# Patient Record
Sex: Female | Born: 1990 | Race: Black or African American | Hispanic: No | Marital: Single | Smoking: Former smoker
Health system: Southern US, Community
[De-identification: ages and names within clinical notes are randomized; demographics above are authoritative.]

## PROBLEM LIST (undated history)

## (undated) DIAGNOSIS — J45909 Unspecified asthma, uncomplicated: Secondary | ICD-10-CM

---

## 2013-05-12 ENCOUNTER — Inpatient Hospital Stay (HOSPITAL_COMMUNITY)
Admission: EM | Admit: 2013-05-12 | Discharge: 2013-05-20 | DRG: 882 | Disposition: A | Discharge status: Home / Self Care | Payer: BC Managed Care – PPO | Attending: Emergency Medicine | Admitting: Emergency Medicine

## 2013-05-12 ENCOUNTER — Emergency Department (HOSPITAL_COMMUNITY): Payer: BC Managed Care – PPO

## 2013-05-12 DIAGNOSIS — J45902 Unspecified asthma with status asthmaticus: Secondary | ICD-10-CM

## 2013-05-12 DIAGNOSIS — R000 Tachycardia, unspecified: Secondary | ICD-10-CM | POA: Diagnosis not present

## 2013-05-12 DIAGNOSIS — J45909 Unspecified asthma, uncomplicated: Secondary | ICD-10-CM

## 2013-05-12 DIAGNOSIS — J209 Acute bronchitis, unspecified: Secondary | ICD-10-CM | POA: Diagnosis present

## 2013-05-12 DIAGNOSIS — N390 Urinary tract infection, site not specified: Secondary | ICD-10-CM | POA: Diagnosis present

## 2013-05-12 DIAGNOSIS — J45901 Unspecified asthma with (acute) exacerbation: Secondary | ICD-10-CM

## 2013-05-12 DIAGNOSIS — I82A12 Acute embolism and thrombosis of left axillary vein: Secondary | ICD-10-CM

## 2013-05-12 DIAGNOSIS — R0603 Acute respiratory distress: Secondary | ICD-10-CM

## 2013-05-12 DIAGNOSIS — Z79899 Other long term (current) drug therapy: Secondary | ICD-10-CM

## 2013-05-12 DIAGNOSIS — Z720 Tobacco use: Secondary | ICD-10-CM

## 2013-05-12 DIAGNOSIS — F17200 Nicotine dependence, unspecified, uncomplicated: Secondary | ICD-10-CM | POA: Diagnosis present

## 2013-05-12 DIAGNOSIS — J9690 Respiratory failure, unspecified, unspecified whether with hypoxia or hypercapnia: Secondary | ICD-10-CM

## 2013-05-12 DIAGNOSIS — I82A19 Acute embolism and thrombosis of unspecified axillary vein: Secondary | ICD-10-CM | POA: Diagnosis not present

## 2013-05-12 DIAGNOSIS — J9600 Acute respiratory failure, unspecified whether with hypoxia or hypercapnia: Principal | ICD-10-CM | POA: Diagnosis present

## 2013-05-12 DIAGNOSIS — T380X5A Adverse effect of glucocorticoids and synthetic analogues, initial encounter: Secondary | ICD-10-CM | POA: Diagnosis not present

## 2013-05-12 DIAGNOSIS — I959 Hypotension, unspecified: Secondary | ICD-10-CM | POA: Diagnosis not present

## 2013-05-12 DIAGNOSIS — R7309 Other abnormal glucose: Secondary | ICD-10-CM | POA: Diagnosis not present

## 2013-05-12 DIAGNOSIS — E876 Hypokalemia: Secondary | ICD-10-CM | POA: Diagnosis present

## 2013-05-12 DIAGNOSIS — B952 Enterococcus as the cause of diseases classified elsewhere: Secondary | ICD-10-CM | POA: Diagnosis present

## 2013-05-12 DIAGNOSIS — J4520 Mild intermittent asthma, uncomplicated: Secondary | ICD-10-CM | POA: Diagnosis present

## 2013-05-12 HISTORY — DX: Unspecified asthma, uncomplicated: J45.909

## 2013-05-12 MED ORDER — IPRATROPIUM BROMIDE 0.02 % IN SOLN
1.0000 mg | Freq: Once | RESPIRATORY_TRACT | Status: AC
  Administered 2013-05-12: 1 mg via RESPIRATORY_TRACT

## 2013-05-12 MED ORDER — CETIRIZINE HCL 5 MG/5ML PO SYRP
5.0000 mg | ORAL_SOLUTION | Freq: Once | ORAL | Status: AC
  Administered 2013-05-13: 5 mg via ORAL
  Filled 2013-05-12: qty 5

## 2013-05-12 MED ORDER — ALBUTEROL (5 MG/ML) CONTINUOUS INHALATION SOLN
20.0000 mg/h | INHALATION_SOLUTION | RESPIRATORY_TRACT | Status: DC
  Administered 2013-05-12: 20 mg/h via RESPIRATORY_TRACT
  Filled 2013-05-12: qty 20

## 2013-05-12 MED ORDER — MAGNESIUM SULFATE 40 MG/ML IJ SOLN
2.0000 g | INTRAMUSCULAR | Status: AC
  Administered 2013-05-12: 2 g via INTRAVENOUS
  Filled 2013-05-12: qty 50

## 2013-05-12 MED ORDER — FAMOTIDINE IN NACL 20-0.9 MG/50ML-% IV SOLN
20.0000 mg | Freq: Once | INTRAVENOUS | Status: AC
  Administered 2013-05-13: 20 mg via INTRAVENOUS
  Filled 2013-05-12: qty 50

## 2013-05-12 MED ORDER — SODIUM CHLORIDE 0.9 % IV SOLN
Freq: Once | INTRAVENOUS | Status: AC
  Administered 2013-05-13: via INTRAVENOUS

## 2013-05-12 MED ORDER — ONDANSETRON HCL 4 MG/2ML IJ SOLN
4.0000 mg | Freq: Once | INTRAMUSCULAR | Status: AC
  Administered 2013-05-12: 4 mg via INTRAVENOUS
  Filled 2013-05-12: qty 2

## 2013-05-12 NOTE — ED Provider Notes (Signed)
CSN: [id number]     Arrival date & time 05/12/13  2246 History   First MD Initiated Contact with Patient 05/12/13 2246     Chief Complaint  Patient presents with  . Shortness of Breath   (Consider location/radiation/quality/duration/timing/severity/associated sxs/prior Treatment) The history is provided by the patient, medical records, a friend, a significant other, the EMS personnel and a parent. No language interpreter was used.    Catherine Morrow is a 22 y.o. female  with a hx of asthma presents to the Emergency Department complaining of gradual, persistent, progressively worsening difficulty breathing onset yesterday evening.  Pt reports she filled her albuterol inhaler yesterday evening and began using it this AM and has used it throughout the day.  Pt denies sick contacts, fever. Associated symptoms include wheezing, DOE, chest tightness.  Nothing makes it better and nothing makes it worse.  Pt denies fever, chills, headache, neck pain, chest pain, abd pain, N/V/D, weakness, dizziness, syncope, dysuria, hematuria.    PT given albuterol 5mg , atrovent 05.mg and solumedrol 125mg  IV by EMS enroute.  Pt's mother reports an episode similar when she was a child, but she was not hospitalized for the event. She denies ever having an intubation because of her asthma.     Past Medical History  Diagnosis Date  . Asthma    History reviewed. No pertinent past surgical history. No family history on file. History  Substance Use Topics  . Smoking status: Not on file  . Smokeless tobacco: Not on file  . Alcohol Use: Not on file   OB History   Grav Para Term Preterm Abortions TAB SAB Ect Mult Living                 Review of Systems  Constitutional: Negative for fever, diaphoresis, appetite change, fatigue and unexpected weight change.  HENT: Negative for mouth sores and neck stiffness.   Eyes: Negative for visual disturbance.  Respiratory: Positive for cough, chest tightness, shortness of breath  and wheezing.   Cardiovascular: Negative for chest pain.  Gastrointestinal: Negative for nausea, vomiting, abdominal pain, diarrhea and constipation.  Endocrine: Negative for polydipsia, polyphagia and polyuria.  Genitourinary: Negative for dysuria, urgency, frequency and hematuria.  Musculoskeletal: Negative for back pain.  Skin: Negative for rash.  Allergic/Immunologic: Negative for immunocompromised state.  Neurological: Negative for syncope, light-headedness and headaches.  Hematological: Does not bruise/bleed easily.  Psychiatric/Behavioral: Negative for sleep disturbance. The patient is not nervous/anxious.     Allergies  Review of patient's allergies indicates no known allergies.  Home Medications   Current Outpatient Rx  Name  Route  Sig  Dispense  Refill  . albuterol (PROVENTIL HFA;VENTOLIN HFA) 108 (90 BASE) MCG/ACT inhaler   Inhalation   Inhale 2 puffs into the lungs every 6 (six) hours as needed for wheezing (SOB).          BP 137/92  Pulse 142  Resp 30  SpO2 100% Physical Exam  Nursing note and vitals reviewed. Constitutional: She appears well-developed and well-nourished. She appears distressed.  Awake, alert, nontoxic appearance  HENT:  Head: Normocephalic and atraumatic.  Mouth/Throat: Oropharynx is clear and moist. No oropharyngeal exudate.  Eyes: Conjunctivae are normal. Pupils are equal, round, and reactive to light. No scleral icterus.  Neck: Normal range of motion. Neck supple.  Tracheal tugging  Cardiovascular: Regular rhythm, S1 normal, S2 normal, normal heart sounds and intact distal pulses.  Tachycardia present.   Pulses:      Radial pulses are 2+ on  the right side, and 2+ on the left side.       Dorsalis pedis pulses are 2+ on the right side, and 2+ on the left side.       Posterior tibial pulses are 2+ on the right side, and 2+ on the left side.  Capillary refill < 3 sec tachycardia  Pulmonary/Chest: Accessory muscle usage present. Tachypnea  noted. She is in respiratory distress. She has decreased breath sounds. She has wheezes. She has no rhonchi. She has no rales. She exhibits no tenderness and no bony tenderness.  Pt speaking only in 1-2 word sentences  Abdominal: Soft. Bowel sounds are normal. She exhibits no distension. There is no tenderness. There is no rebound.  Musculoskeletal: Normal range of motion. She exhibits no edema.  Lymphadenopathy:    She has no cervical adenopathy.  Neurological: She is alert.  Speech is clear and goal oriented Moves extremities without ataxia  Skin: Skin is warm. No rash noted. She is diaphoretic. No erythema.  Warm extremities  No mottling   Psychiatric: She has a normal mood and affect.    ED Course  Procedures (including critical care time) Labs Review Labs Reviewed  CBC - Abnormal; Notable for the following:    WBC 15.8 (*)    MCHC 36.1 (*)    All other components within normal limits  BLOOD GAS, VENOUS - Abnormal; Notable for the following:    pH, Ven 7.234 (*)    pCO2, Ven 51.1 (*)    pO2, Ven 54.8 (*)    Acid-base deficit 6.6 (*)    All other components within normal limits  POCT I-STAT, CHEM 8 - Abnormal; Notable for the following:    BUN 5 (*)    Glucose, Bld 160 (*)    All other components within normal limits   Imaging Review Dg Chest Port 1 View  05/12/2013   CLINICAL DATA:  Severe shortness of breath.  EXAM: PORTABLE CHEST - 1 VIEW  COMPARISON:  No priors.  FINDINGS: Lung volumes are normal. No consolidative airspace disease. No pleural effusions. No pneumothorax. No pulmonary nodule or mass noted. Pulmonary vasculature and the cardiomediastinal silhouette are within normal limits.  IMPRESSION: 1. No radiographic evidence of acute cardiopulmonary disease.   Electronically Signed   By: Trudie Reed M.D.   On: 05/12/2013 23:57    MDM   1. Status asthmaticus   2. Asthma   3. Respiratory distress     [name] presents with status asthmaticus from home.   Patient without history of status, hospitalization or intubation.  She reports 24 hours of difficulty breathing with significant worsening tonight. Patient presents with significant respiratory distress, tachypnea, accessory muscle use and tachycardia.  Patient started on continuous neb but was quickly switched to BiPAP due to her difficulty breathing.   11:52 PM Patient continues to struggle breathing. Accessory muscle use and tracheal tugging persist. Increase tidal volume with wheezing even after magnesium administration.  12:46 AM Patient remains on BiPAP with 100% SpO2 but continues to have accessory muscle use and tracheal tugging. Pt remains tachycardic into the 140's.  Increased work of breathing is concerning. VBG with pH of 7.23, PCO2 of 51.1 and PO2 of 54.8;  We'll consult critical care for admission.  At this time patient does not need to be intubated we'll continue to monitor closely.  1:01 AM Discussed with Critical Care who will evaluate and admit.    Medications  albuterol (PROVENTIL,VENTOLIN) solution continuous neb (20 mg/hr Nebulization  New Bag/Given 05/12/13 2258)  0.9 %  sodium chloride infusion (not administered)  magnesium sulfate IVPB 2 g 50 mL (0 g Intravenous Stopped 05/13/13 0015)  ondansetron (ZOFRAN) injection 4 mg (4 mg Intravenous Given 05/12/13 2315)  ipratropium (ATROVENT) nebulizer solution 1 mg (1 mg Nebulization Given 05/12/13 2321)  cetirizine HCl (Zyrtec) 5 MG/5ML syrup 5 mg (5 mg Oral Given 05/13/13 0011)  famotidine (PEPCID) IVPB 20 mg (20 mg Intravenous New Bag/Given 05/13/13 0013)  ibuprofen (ADVIL,MOTRIN) tablet 800 mg (800 mg Oral Given 05/13/13 0043)    Results for orders placed during the hospital encounter of 05/12/13  CBC      Result Value Range   WBC 15.8 (*) 4.0 - 10.5 K/uL   RBC 4.67  3.87 - 5.11 MIL/uL   Hemoglobin 14.0  12.0 - 15.0 g/dL   HCT 21.3  08.6 - 57.8 %   MCV 83.1  78.0 - 100.0 fL   MCH 30.0  26.0 - 34.0 pg   MCHC 36.1 (*) 30.0 -  36.0 g/dL   RDW 46.9  62.9 - 52.8 %   Platelets 272  150 - 400 K/uL  BLOOD GAS, VENOUS      Result Value Range   FIO2 0.40     Delivery systems BILEVEL POSITIVE AIRWAY PRESSURE     pH, Ven 7.234 (*) 7.250 - 7.300   pCO2, Ven 51.1 (*) 45.0 - 50.0 mmHg   pO2, Ven 54.8 (*) 30.0 - 45.0 mmHg   Bicarbonate 20.8  20.0 - 24.0 mEq/L   TCO2 19.2  0 - 100 mmol/L   Acid-base deficit 6.6 (*) 0.0 - 2.0 mmol/L   O2 Saturation 82.5     Patient temperature 98.6     Collection site VENOUS     Sample type VENOUS    POCT I-STAT, CHEM 8      Result Value Range   Sodium 138  135 - 145 mEq/L   Potassium 3.6  3.5 - 5.1 mEq/L   Chloride 104  96 - 112 mEq/L   BUN 5 (*) 6 - 23 mg/dL   Creatinine, Ser 4.13  0.50 - 1.10 mg/dL   Glucose, Bld 244 (*) 70 - 99 mg/dL   Calcium, Ion 0.10  2.72 - 1.23 mmol/L   TCO2 21  0 - 100 mmol/L   Hemoglobin 14.6  12.0 - 15.0 g/dL   HCT 53.6  64.4 - 03.4 %    CRITICAL CARE Performed by: Dierdre Forth Total critical care time: Critical care time was exclusive of separately billable procedures and treating other patients. Critical care was necessary to treat or prevent imminent or life-threatening deterioration. Critical care was time spent personally by me on the following activities: development of treatment plan with patient and/or surrogate as well as nursing, discussions with consultants, evaluation of patient's response to treatment, examination of patient, obtaining history from patient or surrogate, ordering and performing treatments and interventions, ordering and review of laboratory studies, ordering and review of radiographic studies, pulse oximetry and re-evaluation of patient's condition.   Dr. April Palumbo was consulted, evaluated this patient with me and agrees with the plan.     Dahlia Client Morrisa Aldaba, PA-C 05/13/13 [phone]

## 2013-05-12 NOTE — ED Notes (Signed)
Bed: ZO10 Expected date:  Expected time:  Means of arrival:  Comments: EMS- 22yo F Wheezing

## 2013-05-12 NOTE — ED Notes (Addendum)
Pt transported via EMS, on stretcher with Neb treatment in progress. She was administered in route to hospital Solu- Medrol 125 mg, Atrovent 0.5mg  and Albuterol 5mg . EMS assessed wheezing in all fields. She went to pharmacy to pick up inhaler on yesterday when sx's onset, but no relief.   BP 138/92 HR: 125 SpO2:100 Neb 10L O2

## 2013-05-13 ENCOUNTER — Encounter (HOSPITAL_COMMUNITY): Payer: Self-pay | Admitting: Certified Registered"

## 2013-05-13 ENCOUNTER — Encounter (HOSPITAL_COMMUNITY): Payer: Self-pay | Admitting: *Deleted

## 2013-05-13 ENCOUNTER — Inpatient Hospital Stay (HOSPITAL_COMMUNITY): Payer: BC Managed Care – PPO

## 2013-05-13 ENCOUNTER — Emergency Department (HOSPITAL_COMMUNITY): Payer: BC Managed Care – PPO

## 2013-05-13 DIAGNOSIS — J45901 Unspecified asthma with (acute) exacerbation: Secondary | ICD-10-CM

## 2013-05-13 DIAGNOSIS — J9690 Respiratory failure, unspecified, unspecified whether with hypoxia or hypercapnia: Secondary | ICD-10-CM | POA: Diagnosis present

## 2013-05-13 DIAGNOSIS — J4520 Mild intermittent asthma, uncomplicated: Secondary | ICD-10-CM | POA: Diagnosis present

## 2013-05-13 DIAGNOSIS — J9600 Acute respiratory failure, unspecified whether with hypoxia or hypercapnia: Principal | ICD-10-CM

## 2013-05-13 LAB — CBC
HCT: 38.8 % (ref 36.0–46.0)
MCH: 30 pg (ref 26.0–34.0)
MCHC: 36.1 g/dL — ABNORMAL HIGH (ref 30.0–36.0)
MCV: 83.1 fL (ref 78.0–100.0)
Platelets: 272 10*3/uL (ref 150–400)
RDW: 12.8 % (ref 11.5–15.5)
WBC: 15.8 10*3/uL — ABNORMAL HIGH (ref 4.0–10.5)

## 2013-05-13 LAB — BLOOD GAS, ARTERIAL
Acid-base deficit: 10.3 mmol/L — ABNORMAL HIGH (ref 0.0–2.0)
Acid-base deficit: 1.5 mmol/L (ref 0.0–2.0)
Bicarbonate: 20 mEq/L (ref 20.0–24.0)
Bicarbonate: 18.2 mEq/L — ABNORMAL LOW (ref 20.0–24.0)
Bicarbonate: 18.8 mEq/L — ABNORMAL LOW (ref 20.0–24.0)
Drawn by: 31288
Drawn by: 31288
FIO2: 0.5 %
FIO2: 0.5 %
FIO2: 0.5 %
MECHVT: 500 mL
O2 Saturation: 95.2 %
O2 Saturation: 98 %
O2 Saturation: 98.4 %
PEEP: 5 cmH2O
PEEP: 5 cmH2O
PEEP: 5 cmH2O
Patient temperature: 98.6
Patient temperature: 98.6
Patient temperature: 94.3
Patient temperature: 94.5
RATE: 14 resp/min
RATE: 14 resp/min
TCO2: 18.7 mmol/L (ref 0–100)
TCO2: 23.8 mmol/L (ref 0–100)
pCO2 arterial: 63 mmHg (ref 35.0–45.0)
pCO2 arterial: 55.7 mmHg — ABNORMAL HIGH (ref 35.0–45.0)
pH, Arterial: 6.896 — CL (ref 7.350–7.450)
pH, Arterial: 7.029 — CL (ref 7.350–7.450)
pH, Arterial: 7.084 — CL (ref 7.350–7.450)
pO2, Arterial: 129 mmHg — ABNORMAL HIGH (ref 80.0–100.0)
pO2, Arterial: 159 mmHg — ABNORMAL HIGH (ref 80.0–100.0)
pO2, Arterial: 162 mmHg — ABNORMAL HIGH (ref 80.0–100.0)

## 2013-05-13 LAB — COMPREHENSIVE METABOLIC PANEL
ALT: 11 U/L (ref 0–35)
Alkaline Phosphatase: 56 U/L (ref 39–117)
BUN: 6 mg/dL (ref 6–23)
CO2: 22 mEq/L (ref 19–32)
Chloride: 100 mEq/L (ref 96–112)
GFR calc Af Amer: >90 mL/min (ref >90)
GFR calc non Af Amer: >90 mL/min (ref >90)
Glucose, Bld: 167 mg/dL — ABNORMAL HIGH (ref 70–99)
Potassium: 3.3 mEq/L — ABNORMAL LOW (ref 3.5–5.1)
Sodium: 139 mEq/L (ref 135–145)
Total Bilirubin: 0.5 mg/dL (ref 0.3–1.2)

## 2013-05-13 LAB — BLOOD GAS, VENOUS
Acid-base deficit: 6.6 mmol/L — ABNORMAL HIGH (ref 0.0–2.0)
Bicarbonate: 20.8 mEq/L (ref 20.0–24.0)
O2 Saturation: 82.5 %
Patient temperature: 98.6
TCO2: 19.2 mmol/L (ref 0–100)

## 2013-05-13 LAB — CBC WITH DIFFERENTIAL/PLATELET
Basophils Absolute: 0 10*3/uL (ref 0.0–0.1)
Eosinophils Absolute: 0 10*3/uL (ref 0.0–0.7)
Eosinophils Relative: 0 % (ref 0–5)
HCT: 39.3 % (ref 36.0–46.0)
Lymphocytes Relative: 3 % — ABNORMAL LOW (ref 12–46)
MCH: 29.4 pg (ref 26.0–34.0)
MCHC: 34.4 g/dL (ref 30.0–36.0)
MCV: 85.6 fL (ref 78.0–100.0)
Monocytes Absolute: 0.8 10*3/uL (ref 0.1–1.0)
RDW: 12.9 % (ref 11.5–15.5)
WBC: 16.5 10*3/uL — ABNORMAL HIGH (ref 4.0–10.5)

## 2013-05-13 LAB — RAPID URINE DRUG SCREEN, HOSP PERFORMED
Amphetamines: NOT DETECTED
Barbiturates: NOT DETECTED
Benzodiazepines: POSITIVE — AB

## 2013-05-13 LAB — D-DIMER, QUANTITATIVE: D-Dimer, Quant: 0.49 ug/mL-FEU — ABNORMAL HIGH (ref 0.00–0.48)

## 2013-05-13 LAB — POCT I-STAT, CHEM 8
Calcium, Ion: 1.14 mmol/L (ref 1.12–1.23)
Creatinine, Ser: 0.7 mg/dL (ref 0.50–1.10)
Glucose, Bld: 160 mg/dL — ABNORMAL HIGH (ref 70–99)
HCT: 43 % (ref 36.0–46.0)
Hemoglobin: 14.6 g/dL (ref 12.0–15.0)
Potassium: 3.6 mEq/L (ref 3.5–5.1)
TCO2: 21 mmol/L (ref 0–100)

## 2013-05-13 LAB — GLUCOSE, CAPILLARY: Glucose-Capillary: 150 mg/dL — ABNORMAL HIGH (ref 70–99)

## 2013-05-13 MED ORDER — SODIUM BICARBONATE 8.4 % IV SOLN
100.0000 meq | Freq: Once | INTRAVENOUS | Status: AC
  Administered 2013-05-13: 100 meq via INTRAVENOUS

## 2013-05-13 MED ORDER — SUCCINYLCHOLINE CHLORIDE 20 MG/ML IJ SOLN
60.0000 mg | Freq: Once | INTRAMUSCULAR | Status: AC
  Administered 2013-05-13: 60 mg via INTRAVENOUS

## 2013-05-13 MED ORDER — SODIUM BICARBONATE 8.4 % IV SOLN
INTRAVENOUS | Status: AC
  Filled 2013-05-13: qty 100

## 2013-05-13 MED ORDER — SODIUM CHLORIDE 0.9 % IV SOLN
250.0000 mL | INTRAVENOUS | Status: DC | PRN
  Administered 2013-05-13: 250 mL via INTRAVENOUS

## 2013-05-13 MED ORDER — HEPARIN SODIUM (PORCINE) 5000 UNIT/ML IJ SOLN
5000.0000 [IU] | Freq: Three times a day (TID) | INTRAMUSCULAR | Status: DC
  Administered 2013-05-13 – 2013-05-16 (×10): 5000 [IU] via SUBCUTANEOUS
  Filled 2013-05-13 (×13): qty 1

## 2013-05-13 MED ORDER — ALBUTEROL SULFATE (5 MG/ML) 0.5% IN NEBU
2.5000 mg | INHALATION_SOLUTION | RESPIRATORY_TRACT | Status: DC
  Administered 2013-05-13 – 2013-05-15 (×12): 2.5 mg via RESPIRATORY_TRACT
  Filled 2013-05-13 (×12): qty 0.5

## 2013-05-13 MED ORDER — MORPHINE SULFATE 4 MG/ML IJ SOLN
INTRAMUSCULAR | Status: AC
  Administered 2013-05-13: 4 mg
  Filled 2013-05-13: qty 1

## 2013-05-13 MED ORDER — CHLORHEXIDINE GLUCONATE 0.12 % MT SOLN
OROMUCOSAL | Status: AC
  Filled 2013-05-13: qty 15

## 2013-05-13 MED ORDER — PROPOFOL 10 MG/ML IV BOLUS
100.0000 mg | Freq: Once | INTRAVENOUS | Status: AC
  Administered 2013-05-13: 100 mg via INTRAVENOUS
  Administered 2013-05-13: 60 mg via INTRAVENOUS

## 2013-05-13 MED ORDER — DEXTROSE 5 % IV SOLN
500.0000 mg | Freq: Every day | INTRAVENOUS | Status: DC
  Administered 2013-05-13 – 2013-05-17 (×5): 500 mg via INTRAVENOUS
  Filled 2013-05-13 (×5): qty 500

## 2013-05-13 MED ORDER — METHYLPREDNISOLONE SODIUM SUCC 125 MG IJ SOLR
125.0000 mg | Freq: Four times a day (QID) | INTRAMUSCULAR | Status: DC
  Administered 2013-05-13 – 2013-05-15 (×8): 125 mg via INTRAVENOUS
  Filled 2013-05-13 (×13): qty 2

## 2013-05-13 MED ORDER — ROCURONIUM BROMIDE 50 MG/5ML IV SOLN
40.0000 mg | Freq: Once | INTRAVENOUS | Status: AC
  Administered 2013-05-13: 40 mg via INTRAVENOUS

## 2013-05-13 MED ORDER — SODIUM CHLORIDE 0.9 % IV SOLN
25.0000 ug/h | INTRAVENOUS | Status: DC
  Administered 2013-05-13: 200 ug/h via INTRAVENOUS
  Administered 2013-05-13: 150 ug/h via INTRAVENOUS
  Administered 2013-05-14: 350 ug/h via INTRAVENOUS
  Administered 2013-05-14: 300 ug/h via INTRAVENOUS
  Administered 2013-05-15: 150 ug/h via INTRAVENOUS
  Administered 2013-05-15: 250 ug/h via INTRAVENOUS
  Administered 2013-05-16: 100 ug/h via INTRAVENOUS
  Filled 2013-05-13 (×7): qty 50

## 2013-05-13 MED ORDER — INFLUENZA VAC SPLIT QUAD 0.5 ML IM SUSP
0.5000 mL | INTRAMUSCULAR | Status: AC
  Administered 2013-05-14: 0.5 mL via INTRAMUSCULAR
  Filled 2013-05-13 (×2): qty 0.5

## 2013-05-13 MED ORDER — CISATRACURIUM BOLUS VIA INFUSION
0.0500 mg/kg | Freq: Once | INTRAVENOUS | Status: AC
  Administered 2013-05-13: 3 mg via INTRAVENOUS
  Filled 2013-05-13: qty 3

## 2013-05-13 MED ORDER — SODIUM CHLORIDE 0.9 % IV BOLUS (SEPSIS)
500.0000 mL | Freq: Once | INTRAVENOUS | Status: AC
  Administered 2013-05-13: 500 mL via INTRAVENOUS

## 2013-05-13 MED ORDER — LEVOFLOXACIN IN D5W 750 MG/150ML IV SOLN
750.0000 mg | INTRAVENOUS | Status: DC
Start: 2013-05-13 — End: 2013-05-13

## 2013-05-13 MED ORDER — PROPOFOL 10 MG/ML IV BOLUS: 0.5000 mg/kg | Freq: Once | INTRAVENOUS | Status: DC

## 2013-05-13 MED ORDER — MIDAZOLAM HCL 2 MG/2ML IJ SOLN: 4.0000 mg | Freq: Once | INTRAMUSCULAR | Status: DC

## 2013-05-13 MED ORDER — ALBUTEROL SULFATE HFA 108 (90 BASE) MCG/ACT IN AERS
4.0000 | INHALATION_SPRAY | RESPIRATORY_TRACT | Status: DC
  Administered 2013-05-13 (×2): 4 via RESPIRATORY_TRACT
  Filled 2013-05-13: qty 6.7

## 2013-05-13 MED ORDER — MIDAZOLAM HCL 2 MG/2ML IJ SOLN
2.0000 mg | Freq: Once | INTRAMUSCULAR | Status: AC
  Administered 2013-05-13: 2 mg via INTRAVENOUS

## 2013-05-13 MED ORDER — PANTOPRAZOLE SODIUM 40 MG IV SOLR
40.0000 mg | Freq: Every day | INTRAVENOUS | Status: DC
  Administered 2013-05-13 – 2013-05-14 (×2): 40 mg via INTRAVENOUS
  Filled 2013-05-13 (×3): qty 40

## 2013-05-13 MED ORDER — SODIUM BICARBONATE 8.4 % IV SOLN
Freq: Once | INTRAVENOUS | Status: AC
  Administered 2013-05-13: 04:00:00 via INTRAVENOUS
  Filled 2013-05-13: qty 850

## 2013-05-13 MED ORDER — MIDAZOLAM HCL 2 MG/2ML IJ SOLN
INTRAMUSCULAR | Status: AC
  Administered 2013-05-13: 4 mg
  Filled 2013-05-13: qty 6

## 2013-05-13 MED ORDER — ARTIFICIAL TEARS OP OINT
1.0000 "application " | TOPICAL_OINTMENT | Freq: Three times a day (TID) | OPHTHALMIC | Status: DC
  Administered 2013-05-13 – 2013-05-15 (×6): 1 via OPHTHALMIC
  Filled 2013-05-13: qty 3.5

## 2013-05-13 MED ORDER — PHENYLEPHRINE HCL 10 MG/ML IJ SOLN
30.0000 ug/min | INTRAVENOUS | Status: DC
  Filled 2013-05-13: qty 1

## 2013-05-13 MED ORDER — PNEUMOCOCCAL VAC POLYVALENT 25 MCG/0.5ML IJ INJ
0.5000 mL | INJECTION | INTRAMUSCULAR | Status: AC
  Administered 2013-05-14: 0.5 mL via INTRAMUSCULAR
  Filled 2013-05-13 (×2): qty 0.5

## 2013-05-13 MED ORDER — ALBUTEROL SULFATE HFA 108 (90 BASE) MCG/ACT IN AERS: 4.0000 | INHALATION_SPRAY | RESPIRATORY_TRACT | Status: DC | PRN

## 2013-05-13 MED ORDER — SODIUM BICARBONATE 8.4 % IV SOLN
INTRAVENOUS | Status: DC
  Administered 2013-05-13: 07:00:00 via INTRAVENOUS
  Filled 2013-05-13 (×11): qty 850

## 2013-05-13 MED ORDER — METHYLPREDNISOLONE SODIUM SUCC 125 MG IJ SOLR
125.0000 mg | Freq: Two times a day (BID) | INTRAMUSCULAR | Status: DC
  Filled 2013-05-13 (×2): qty 2

## 2013-05-13 MED ORDER — PROPOFOL 10 MG/ML IV BOLUS
INTRAVENOUS | Status: AC
  Filled 2013-05-13: qty 1

## 2013-05-13 MED ORDER — IBUPROFEN 800 MG PO TABS
800.0000 mg | ORAL_TABLET | Freq: Once | ORAL | Status: AC
  Administered 2013-05-13: 800 mg via ORAL
  Filled 2013-05-13: qty 1

## 2013-05-13 MED ORDER — SODIUM CHLORIDE 0.9 % IV SOLN
3.0000 ug/kg/min | INTRAVENOUS | Status: DC
  Administered 2013-05-13: 3 ug/kg/min via INTRAVENOUS
  Administered 2013-05-13: 5 ug/kg/min via INTRAVENOUS
  Administered 2013-05-14: 3 ug/kg/min via INTRAVENOUS
  Filled 2013-05-13 (×3): qty 20

## 2013-05-13 MED ORDER — SODIUM CHLORIDE 0.9 % IV BOLUS (SEPSIS)
750.0000 mL | Freq: Once | INTRAVENOUS | Status: AC
  Administered 2013-05-13: 750 mL via INTRAVENOUS

## 2013-05-13 MED ORDER — FENTANYL BOLUS VIA INFUSION
25.0000 ug | Freq: Four times a day (QID) | INTRAVENOUS | Status: DC | PRN
  Filled 2013-05-13: qty 100

## 2013-05-13 MED ORDER — MORPHINE SULFATE 4 MG/ML IJ SOLN: 4.0000 mg | Freq: Once | INTRAMUSCULAR | Status: DC

## 2013-05-13 MED ORDER — PROPOFOL 10 MG/ML IV EMUL
INTRAVENOUS | Status: AC
  Administered 2013-05-13: 1000 mg
  Filled 2013-05-13: qty 100

## 2013-05-13 MED ORDER — PROPOFOL 10 MG/ML IV BOLUS
30.0000 mg | Freq: Once | INTRAVENOUS | Status: AC
  Administered 2013-05-13: 30 mg via INTRAVENOUS

## 2013-05-13 MED ORDER — ALBUTEROL SULFATE HFA 108 (90 BASE) MCG/ACT IN AERS
INHALATION_SPRAY | RESPIRATORY_TRACT | Status: AC
  Administered 2013-05-13: 03:00:00
  Filled 2013-05-13: qty 6.7

## 2013-05-13 MED ORDER — PROPOFOL 10 MG/ML IV BOLUS
INTRAVENOUS | Status: AC
  Administered 2013-05-13: 60 mg via INTRAVENOUS
  Filled 2013-05-13: qty 1

## 2013-05-13 MED ORDER — MORPHINE SULFATE 4 MG/ML IJ SOLN
4.0000 mg | Freq: Once | INTRAMUSCULAR | Status: DC
  Filled 2013-05-13: qty 1

## 2013-05-13 MED ORDER — BIOTENE DRY MOUTH MT LIQD
15.0000 mL | Freq: Four times a day (QID) | OROMUCOSAL | Status: DC
  Administered 2013-05-13 – 2013-05-16 (×14): 15 mL via OROMUCOSAL

## 2013-05-13 MED ORDER — PROPOFOL 10 MG/ML IV EMUL
5.0000 ug/kg/min | INTRAVENOUS | Status: DC
  Administered 2013-05-13: 50 ug/kg/min via INTRAVENOUS
  Administered 2013-05-13 (×2): 40 ug/kg/min via INTRAVENOUS
  Administered 2013-05-14 (×2): 50 ug/kg/min via INTRAVENOUS
  Administered 2013-05-14: 40 ug/kg/min via INTRAVENOUS
  Administered 2013-05-14: 50 ug/kg/min via INTRAVENOUS
  Administered 2013-05-15: 20 ug/kg/min via INTRAVENOUS
  Administered 2013-05-15: 30 ug/kg/min via INTRAVENOUS
  Administered 2013-05-15: 38 ug/kg/min via INTRAVENOUS
  Administered 2013-05-16: 15 ug/kg/min via INTRAVENOUS
  Filled 2013-05-13 (×11): qty 100

## 2013-05-13 MED ORDER — MORPHINE SULFATE 4 MG/ML IJ SOLN
4.0000 mg | Freq: Once | INTRAMUSCULAR | Status: AC
  Administered 2013-05-13: 4 mg via INTRAVENOUS

## 2013-05-13 MED ORDER — CHLORHEXIDINE GLUCONATE 0.12 % MT SOLN
15.0000 mL | Freq: Two times a day (BID) | OROMUCOSAL | Status: DC
  Administered 2013-05-13 – 2013-05-20 (×15): 15 mL via OROMUCOSAL
  Filled 2013-05-13 (×16): qty 15

## 2013-05-13 NOTE — ED Notes (Signed)
Patient family moved to consult room. Chaplain called, ETA 30 minutes

## 2013-05-13 NOTE — Progress Notes (Signed)
PM ICU Rounds  Subjective: Remains on heavy sedation, paralytic, and HCO3 gtt.  Objective: Temp:  [94.2 F (34.6 C)-100.2 F (37.9 C)] 98.4 F (36.9 C) (09/15 1500) Pulse Rate:  [60-159] 104 (09/15 1500) Resp:  [13-30] 14 (09/15 1500) BP: (104-174)/(45-153) 135/80 mmHg (09/15 0501) SpO2:  [48 %-100 %] 100 % (09/15 1500) Arterial Line BP: (74-201)/(43-114) 96/62 mmHg (09/15 1500) FiO2 (%):  [40 %-50 %] 40 % (09/15 1545) Weight:  [130 lb (58.968 kg)-132 lb 4.4 oz (60 kg)] 132 lb 4.4 oz (60 kg) (09/15 0500)  CVP:  [11 mmHg] 11 mmHg  Vent Mode:  [-] PRVC FiO2 (%):  [40 %-50 %] 40 % Set Rate:  [10 bmp-14 bmp] 14 bmp Vt Set:  [350 mL-500 mL] 500 mL PEEP:  [5 cmH20] 5 cmH20 Plateau Pressure:  [22 cmH20-26 cmH20] 26 cmH20   Neuro: Sedated, paralyzed HEENT: ETT in place Cardiac: regular, tachycardic Chest: faint b/l expiratory wheeze Abd: soft, non tender Ext: no edema  CBC Recent Labs     05/13/13  0032  05/13/13  0045  05/13/13  0655  WBC  15.8*   --   16.5*  HGB  14.0  14.6  13.5  HCT  38.8  43.0  39.3  PLT  272   --   230    Coag's Recent Labs     05/13/13  0655  APTT  25  INR  1.20    BMET Recent Labs     05/13/13  0045  05/13/13  0655  NA  138  139  K  3.6  3.3*  CL  104  100  CO2   --   22  BUN  5*  6  CREATININE  0.70  0.60  GLUCOSE  160*  167*    Electrolytes Recent Labs     05/13/13  0655  CALCIUM  8.2*  MG  1.9  PHOS  4.5    ABG Recent Labs     05/13/13  0441  05/13/13  0525  05/13/13  1152  PHART  7.084*  7.107*  7.279*  PCO2ART  63.0*  57.7*  55.7*  PO2ART  155.0*  159.0*  162.0*    Liver Enzymes Recent Labs     05/13/13  0655  AST  15  ALT  11  ALKPHOS  56  BILITOT  0.5  ALBUMIN  3.8    Glucose Recent Labs     05/13/13  1250  GLUCAP  150*    Imaging Dg Chest Port 1 View  05/13/2013   *RADIOLOGY REPORT*  Clinical Data: Right-sided central line placement  PORTABLE CHEST - 1 VIEW  Comparison: 05/13/2013 at  4:57 a.m.  Findings: Right IJ central line tip terminates 4 cm below the carina.  Heart size is normal. Endotracheal and nasogastric tubes appropriately positioned. No pleural effusion.  No acute osseous finding.  No pneumothorax.  IMPRESSION: Right IJ central line tip over distal SVC.   Original Report Authenticated By: Christiana Pellant, M.D.   Dg Chest Port 1 View  05/13/2013   *RADIOLOGY REPORT*  Clinical Data: Endotracheal tube manipulation  PORTABLE CHEST - 1 VIEW  Comparison: Prior radiograph performed earlier on the same day at 02:50 am.  Findings: The tip of the endotracheal tube is located 4.4 cm above the carina.  Enteric tube overlies the stomach.  There is been no significant interval change in the appearance of the heart and lungs.  No pneumothorax.  No frank pulmonary edema  or airspace consolidation.  IMPRESSION: Tip of the endotracheal tube 4.4 cm above the carina.  Otherwise stable appearance of her lungs.   Original Report Authenticated By: Rise Mu, M.D.   Dg Chest Portable 1 View  05/13/2013   *RADIOLOGY REPORT*  Clinical Data: Endotracheal tube placement  PORTABLE CHEST - 1 VIEW  Comparison: Radiograph from 05/12/2013  Findings: There is been interval placement of an endotracheal tube with tip located 5.2 cm above the carina.  Side hole enteric tube is well beyond the GE junction and overlies the stomach.  Cardiac and mediastinal silhouettes are unchanged and remain within normal limits.  The lungs remain clear without focal infiltrate, pulmonary edema, or pleural effusion.  No pneumothorax.  No acute osseous abnormality.  IMPRESSION: 1.  Tip of the endotracheal tube 5.2 cm above the carina. 2.  No radiographic evidence of acute cardiopulmonary process.   Original Report Authenticated By: Rise Mu, M.D.   Dg Chest Port 1 View  05/12/2013   CLINICAL DATA:  Severe shortness of breath.  EXAM: PORTABLE CHEST - 1 VIEW  COMPARISON:  No priors.  FINDINGS: Lung volumes are  normal. No consolidative airspace disease. No pleural effusions. No pneumothorax. No pulmonary nodule or mass noted. Pulmonary vasculature and the cardiomediastinal silhouette are within normal limits.  IMPRESSION: 1. No radiographic evidence of acute cardiopulmonary disease.   Electronically Signed   By: Trudie Reed M.D.   On: 05/12/2013 23:57    Impression:  A: Acute hypoxic/hypercapnic respiratory failure 2nd to Acute asthma exacerbation. P: Continue full vent support with permissive hypercapnia Continue heavy sedation and paralytic for now Continue scheduled BD's and solumedrol  CC time 40 minutes  Updated pt's mother and aunt at bedside.  Discussed current treatment plan.  Also explained concern about muscle weakness in setting of steroid/paralytic use >> this will need to be monitored for.  Coralyn Helling, MD [facility] Pulmonary/Critical Care 05/13/2013, 4:55 PM Pager:  [phone] After 3pm call: [phone]

## 2013-05-13 NOTE — Procedures (Signed)
Arterial Catheter Insertion Procedure Note Catherine Morrow [id number] 07-Mar-1991  Procedure: Insertion of Arterial Catheter  Indications: Blood pressure monitoring and Frequent blood sampling  Procedure Details Consent: Unable to obtain consent because of emergent medical necessity. Time Out: Verified patient identification, verified procedure, site/side was marked, verified correct patient position, special equipment/implants available, medications/allergies/relevent history reviewed, required imaging and test results available.  Performed  Maximum sterile technique was used including gloves, hand hygiene and mask. Skin prep: Chlorhexidine; local anesthetic administered 22 gauge catheter was inserted into right radial artery using the Seldinger technique.  Evaluation Blood flow good; BP tracing good. Complications: No apparent complications.   Delonda Coley R. 05/13/2013

## 2013-05-13 NOTE — ED Notes (Signed)
2nd attempt intubation

## 2013-05-13 NOTE — Procedures (Signed)
Central Venous Catheter Insertion Procedure Note Catherine Morrow [id number] [birth year]  Procedure: Insertion of Central Venous Catheter Indications: Assessment of intravascular volume, Drug and/or fluid administration and Frequent blood sampling Real time [place] used to ID and cannulate the vessel  Procedure Details Consent: Risks of procedure as well as the alternatives and risks of each were explained to the (patient/caregiver).  Consent for procedure obtained. Time Out: Verified patient identification, verified procedure, site/side was marked, verified correct patient position, special equipment/implants available, medications/allergies/relevent history reviewed, required imaging and test results available.  Performed  Maximum sterile technique was used including antiseptics, cap, gloves, gown, hand hygiene, mask and sheet. Skin prep: Chlorhexidine; local anesthetic administered A antimicrobial bonded/coated triple lumen catheter was placed in the right internal jugular vein using the Seldinger technique.  Evaluation Blood flow good Complications: No apparent complications Patient did tolerate procedure well. Chest X-ray ordered to verify placement.  CXR: pending.  BABCOCK,PETE 05/13/2013, 1:56 PM  Coralyn Helling, MD [facility] Pulmonary/Critical Care 05/13/2013, 4:11 PM Pager:  [phone] After 3pm call: [phone]

## 2013-05-13 NOTE — Procedures (Signed)
Intubation Procedure Note Catherine Morrow [id number] [birth year]  Procedure: Intubation Indications: Respiratory insufficiency  Procedure Details Consent: Unable to obtain consent because of acuity of illness. Patient and family verbally consented.. Time Out: Verified patient identification, verified procedure, site/side was marked, verified correct patient position, special equipment/implants available, medications/allergies/relevent history reviewed, required imaging and test results available.  Performed  Maximum sterile technique was used including gloves and mask.  3    Evaluation Hemodynamic Status: BP stable throughout; O2 sats: transiently fell during during procedure and currently acceptable Patient's Current Condition: stable Complications: No apparent complications Patient did not tolerate procedure well. Chest X-ray ordered to verify placement.  CXR: tube position acceptable.  Attempted x 2. Grade 1 view. (+) Color change x 2. In both cases Sats dropped shortly after plugging patient into ventilator. Patient required extensive bagging to regain sats. CRNA on call called in. Attempted x 2. On second attempt patient sats maintained.   Catherine Morrow R. 05/13/2013

## 2013-05-13 NOTE — ED Notes (Signed)
Intubation completed at 02:42 by CRNA on call.   7.0 cm /  23 cm @ teeth

## 2013-05-13 NOTE — ED Notes (Signed)
Dr Nicanor Alcon was unaware CCM was at bedside and had made the decision to intubate the patient. Patient had 2 intubation attempts made by CCM MD prior to Dr Nicanor Alcon being notified.

## 2013-05-13 NOTE — Progress Notes (Signed)
PULMONARY  / CRITICAL CARE MEDICINE  Name: Catherine Morrow MRN: [id number] DOB: 01-21-1991    ADMISSION DATE:  05/12/2013 CONSULTATION DATE:  05/13/2013  REFERRING MD :  Dr. Nicanor Alcon PRIMARY SERVICE: PCCM  CHIEF COMPLAINT:  SOB  BRIEF PATIENT DESCRIPTION: 62 F with severe asthma exacerbation 9/14 and impending respiratory failure.  SIGNIFICANT EVENTS / STUDIES:  Intubated 05/13/2013>>>  LINES / TUBES: ETT 05/13/2013>>>  CULTURES 9/15 sputum >>>  ANTIBIOTICS azithro 9/15>>>  PHYSICAL EXAM  VITAL SIGNS: Temp:  [94.2 F (34.6 C)-96.4 F (35.8 C)] 96.4 F (35.8 C) (09/15 0915) Pulse Rate:  [60-159] 115 (09/15 0915) Resp:  [13-30] 14 (09/15 0915) BP: (104-174)/(45-153) 135/80 mmHg (09/15 0501) SpO2:  [48 %-100 %] 100 % (09/15 0915) Arterial Line BP: (85-201)/(47-114) 94/56 mmHg (09/15 0915) FiO2 (%):  [40 %-50 %] 50 % (09/15 0731) Weight:  [58.968 kg (130 lb)] 58.968 kg (130 lb) (09/15 0256)  HEMODYNAMICS:    VENTILATOR SETTINGS: Vent Mode:  [-] PRVC FiO2 (%):  [40 %-50 %] 50 % Set Rate:  [10 bmp-14 bmp] 14 bmp Vt Set:  [350 mL-500 mL] 500 mL PEEP:  [5 cmH20] 5 cmH20 Plateau Pressure:  [22 cmH20-25 cmH20] 22 cmH20  INTAKE / OUTPUT: Intake/Output     09/14 0701 - 09/15 0700 09/15 0701 - 09/16 0700   I.V. (mL/kg)  334.4 (5.7)   Total Intake(mL/kg)  334.4 (5.7)   Urine (mL/kg/hr)  600 (3.9)   Total Output   600   Net   -265.6          PHYSICAL EXAMINATION: General:  Sedated on vent  Neuro:  GCS 3 on NMB gtt HEENT:  Sclera anicteric, Conjunctiva Pink, orally intubated  Cardiovascular: Tachy, Reg Rhythem, NS1/S2, (-) MRG Lungs:  Diffuse wheezing  Abdomen:  S/NT/ND/(+)BS Musculoskeletal:  (-) C/C/E Skin:  (-) Rash  LABS:  CBC Recent Labs     05/13/13  0032  05/13/13  0045  05/13/13  0655  WBC  15.8*   --   16.5*  HGB  14.0  14.6  13.5  HCT  38.8  43.0  39.3  PLT  272   --   230    Coag's Recent Labs     05/13/13  0655  APTT  25  INR  1.20     BMET Recent Labs     05/13/13  0045  05/13/13  0655  NA  138  139  K  3.6  3.3*  CL  104  100  CO2   --   22  BUN  5*  6  CREATININE  0.70  0.60  GLUCOSE  160*  167*    Electrolytes Recent Labs     05/13/13  0655  CALCIUM  8.2*  MG  1.9  PHOS  4.5    Sepsis Markers No results found for this basename: LACTICACIDVEN, PROCALCITON, O2SATVEN,  in the last 72 hours  ABG Recent Labs     05/13/13  0406  05/13/13  0441  05/13/13  0525  PHART  7.029*  7.084*  7.107*  PCO2ART  79.8*  63.0*  57.7*  PO2ART  168.0*  155.0*  159.0*    Liver Enzymes Recent Labs     05/13/13  0655  AST  15  ALT  11  ALKPHOS  56  BILITOT  0.5  ALBUMIN  3.8    Cardiac Enzymes No results found for this basename: TROPONINI, PROBNP,  in the last 72 hours  Glucose No  results found for this basename: GLUCAP,  in the last 72 hours  Imaging Dg Chest Port 1 View  05/13/2013   *RADIOLOGY REPORT*  Clinical Data: Endotracheal tube manipulation  PORTABLE CHEST - 1 VIEW  Comparison: Prior radiograph performed earlier on the same day at 02:50 am.  Findings: The tip of the endotracheal tube is located 4.4 cm above the carina.  Enteric tube overlies the stomach.  There is been no significant interval change in the appearance of the heart and lungs.  No pneumothorax.  No frank pulmonary edema or airspace consolidation.  IMPRESSION: Tip of the endotracheal tube 4.4 cm above the carina.  Otherwise stable appearance of her lungs.   Original Report Authenticated By: Rise Mu, M.D.   Dg Chest Portable 1 View  05/13/2013   *RADIOLOGY REPORT*  Clinical Data: Endotracheal tube placement  PORTABLE CHEST - 1 VIEW  Comparison: Radiograph from 05/12/2013  Findings: There is been interval placement of an endotracheal tube with tip located 5.2 cm above the carina.  Side hole enteric tube is well beyond the GE junction and overlies the stomach.  Cardiac and mediastinal silhouettes are unchanged and remain  within normal limits.  The lungs remain clear without focal infiltrate, pulmonary edema, or pleural effusion.  No pneumothorax.  No acute osseous abnormality.  IMPRESSION: 1.  Tip of the endotracheal tube 5.2 cm above the carina. 2.  No radiographic evidence of acute cardiopulmonary process.   Original Report Authenticated By: Rise Mu, M.D.   Dg Chest Port 1 View  05/12/2013   CLINICAL DATA:  Severe shortness of breath.  EXAM: PORTABLE CHEST - 1 VIEW  COMPARISON:  No priors.  FINDINGS: Lung volumes are normal. No consolidative airspace disease. No pleural effusions. No pneumothorax. No pulmonary nodule or mass noted. Pulmonary vasculature and the cardiomediastinal silhouette are within normal limits.  IMPRESSION: 1. No radiographic evidence of acute cardiopulmonary disease.   Electronically Signed   By: Trudie Reed M.D.   On: 05/12/2013 23:57    EKG: None CXR: Chest X-ray post-intubation was personally reviewed by me. The ETT is above the clavicles. The lungs are clear.   ASSESSMENT / PLAN: Principal Problem:   Respiratory failure Active Problems:   Asthma exacerbation   PULMONARY A: Acute Hypoxic and hypercapneic Respiratory Failure 2/2 Asthma Exacerbation Unclear, 2 d of progressive dyspnea, but also mom notes about 3 weeks ago suddenly needed SABA (since school started back).   P:   Gentle mechanical ventilation, long E time Goal SpO2 >= 92% Paralysis x 24 hours and reassess Solumedrol 125 mg q 6 q 6 nebs standing, q 2 PRN f/u viral panel See ID section Cont neb Ck UDS Close obs of abg AND gas exchange  On Discharge needs smoking cessation counseling and PCP   CARDIOVASCULAR A: No Acute Issues  RENAL A: Hypokalemia  P: Replace and recheck   GASTROINTESTINAL A: No Acute Issues: P: PPI and [place] heparin  HEMATOLOGIC A:  No Acute Issues  INFECTIOUS A: No Acute Issues P:  Empiric azithro for asthmatic exacerbation   ENDOCRINE A: Steroid induced  hyperglycemia  P: ssi protocol  NEUROLOGIC A: No Acute Issues  P: Supportive care    I have personally obtained a history, examined the patient, evaluated laboratory and imaging results, formulated the assessment and plan and placed orders.  CRITICAL CARE: The patient is critically ill with multiple organ systems failure and requires high complexity decision making for assessment and support, frequent evaluation and titration of  therapies, application of advanced monitoring technologies and extensive interpretation of multiple databases. Critical Care Time devoted to patient care services described in this note is 60 minutes.   Levy Pupa, MD, PhD 05/13/2013, 10:22 AM [facility] [phone] or if no answer [phone]

## 2013-05-13 NOTE — Progress Notes (Signed)
Patients temperature 95.5. Placed on [employer].

## 2013-05-13 NOTE — ED Provider Notes (Signed)
Medical screening examination/treatment/procedure(s) were conducted as a shared visit with non-physician practitioner(s) and myself.  I personally evaluated the patient during the encounter  See history by Catherine Morrow Client NCAT EOMI On bipap oral exam deferred Tachy [id number] Tachypnea and wheezes NABS FROM x 4 extremities Skin warm and dry  Plan nebs and admit to pccm  Kaileigh Viswanathan Smitty Cords, MD 05/13/13 0104

## 2013-05-13 NOTE — ED Notes (Signed)
1st attempt of intubation

## 2013-05-13 NOTE — ED Notes (Signed)
Anaesthesia called

## 2013-05-13 NOTE — H&P (Addendum)
PULMONARY  / CRITICAL CARE MEDICINE  Name: Catherine Morrow MRN: [id number] DOB: 01-29-1991    ADMISSION DATE:  05/12/2013 CONSULTATION DATE:  05/13/2013  REFERRING MD :  Dr. Nicanor Alcon PRIMARY SERVICE: PCCM  CHIEF COMPLAINT:  SOB  BRIEF PATIENT DESCRIPTION: 69 F with severe asthma exacerbation and impending respiratory failure.  SIGNIFICANT EVENTS / STUDIES:  1. Intubated 05/13/2013  LINES / TUBES: 1. ETT 05/13/2013  CULTURES: 1. None  ANTIBIOTICS: 1. None  HISTORY OF PRESENT ILLNESS:  Catherine Morrow is a 22 yo F with asthma who presented to [facility] on 05/12/2013 with severe shortness of breath. The following history was obtained from the patient's mother and chart review as by the time I arrived she was on BiPAP and complaining of fatigue. Her mother notes that she developed some SOB 2 days ago. She had an albuterol inhaler filled but her symptoms became worse. She decided to come to [facility] this evening. She did not respond to several rounds of nebulizers and was eventually placed on BiPAP. She indicated that she was becoming tired and reluctantly agreed to intubation. Please see separate documentation for intubation procedure details and events.  PAST MEDICAL HISTORY :  Past Medical History  Diagnosis Date  . Asthma     History reviewed. No pertinent past surgical history.  Prior to Admission medications   Medication Sig Start Date End Date Taking? Authorizing Provider  albuterol (PROVENTIL HFA;VENTOLIN HFA) 108 (90 BASE) MCG/ACT inhaler Inhale 2 puffs into the lungs every 6 (six) hours as needed for wheezing (SOB).   Yes Historical Provider, MD    No Known Allergies  FAMILY HISTORY:  Family History  Problem Relation Age of Onset  . Asthma Mother     SOCIAL HISTORY:  reports that she has been smoking Cigarettes.  She has been smoking about 0.00 packs per day. She does not have any smokeless tobacco history on file. Her alcohol and drug histories are not on file.  REVIEW OF SYSTEMS:   Unable to obtain secondary to patient condition.   PHYSICAL EXAM  VITAL SIGNS: Pulse Rate:  [95-159] 95 (09/15 0300) Resp:  [14-30] 21 (09/15 0300) BP: (119-174)/(83-153) 147/97 mmHg (09/15 0300) SpO2:  [99 %-100 %] 100 % (09/15 0300) FiO2 (%):  [40 %] 40 % (09/15 0124) Weight:  [130 lb (58.968 kg)] 130 lb (58.968 kg) (09/15 0256)  HEMODYNAMICS:    VENTILATOR SETTINGS: Vent Mode:  [-]  FiO2 (%):  [40 %] 40 %  INTAKE / OUTPUT: Intake/Output   None     PHYSICAL EXAMINATION: General:  Diaphoretic F in moderate respiratory distress on presentation Neuro:  Grossly intact HEENT:  Sclera anicteric, Conjunctiva Pink, BiPAP mask in place initially Neck:  Trachea supple and midline, (-) LAN, (-) JVD Cardiovascular: Tachy, Reg Rhythem, NS1/S2, (-) MRG Lungs:  Diffuse wheezing with poor air entry Abdomen:  S/NT/ND/(+)BS Musculoskeletal:  (-) C/C/E Skin:  (-) Rash  LABS:  CBC Recent Labs     05/13/13  0032  05/13/13  0045  WBC  15.8*   --   HGB  14.0  14.6  HCT  38.8  43.0  PLT  272   --     Coag's No results found for this basename: APTT, INR,  in the last 72 hours  BMET Recent Labs     05/13/13  0045  NA  138  K  3.6  CL  104  BUN  5*  CREATININE  0.70  GLUCOSE  160*    Electrolytes  No results found for this basename: CALCIUM, MG, PHOS,  in the last 72 hours  Sepsis Markers No results found for this basename: LACTICACIDVEN, PROCALCITON, O2SATVEN,  in the last 72 hours  ABG No results found for this basename: PHART, PCO2ART, PO2ART,  in the last 72 hours  Liver Enzymes No results found for this basename: AST, ALT, ALKPHOS, BILITOT, ALBUMIN,  in the last 72 hours  Cardiac Enzymes No results found for this basename: TROPONINI, PROBNP,  in the last 72 hours  Glucose No results found for this basename: GLUCAP,  in the last 72 hours  Imaging Dg Chest Portable 1 View  05/13/2013   *RADIOLOGY REPORT*  Clinical Data: Endotracheal tube placement   PORTABLE CHEST - 1 VIEW  Comparison: Radiograph from 05/12/2013  Findings: There is been interval placement of an endotracheal tube with tip located 5.2 cm above the carina.  Side hole enteric tube is well beyond the GE junction and overlies the stomach.  Cardiac and mediastinal silhouettes are unchanged and remain within normal limits.  The lungs remain clear without focal infiltrate, pulmonary edema, or pleural effusion.  No pneumothorax.  No acute osseous abnormality.  IMPRESSION: 1.  Tip of the endotracheal tube 5.2 cm above the carina. 2.  No radiographic evidence of acute cardiopulmonary process.   Original Report Authenticated By: Rise Mu, M.D.   Dg Chest Port 1 View  05/12/2013   CLINICAL DATA:  Severe shortness of breath.  EXAM: PORTABLE CHEST - 1 VIEW  COMPARISON:  No priors.  FINDINGS: Lung volumes are normal. No consolidative airspace disease. No pleural effusions. No pneumothorax. No pulmonary nodule or mass noted. Pulmonary vasculature and the cardiomediastinal silhouette are within normal limits.  IMPRESSION: 1. No radiographic evidence of acute cardiopulmonary disease.   Electronically Signed   By: Trudie Reed M.D.   On: 05/12/2013 23:57    EKG: None CXR: Chest X-ray post-intubation was personally reviewed by me. The ETT is above the clavicles. The lungs are clear.   ASSESSMENT / PLAN: Principal Problem:   Respiratory failure Active Problems:   Asthma exacerbation   PULMONARY A: 1. Acute Hypoxic Respiratory Failure 2/2 Asthma Exacerbation: This is the most likely explanation for her presentation. There is no evidence of pneumonia on CXRay. There is no clear history of inciting infection.  P:    Gentle mechanical ventilation, long E time  Goal SpO2 >= 92%  Paralysis  Transfer to [facility], Consider Transfer to ECMO center  Solumedrol 125 mg IV bid  q 6 nebs standing, q 2 PRN  Check viral panel  Emperic Levaquin  On Discharge needs smoking  cessation counseling and PCP   CARDIOVASCULAR A: No Acute Issues  RENAL A: No Acute Issues:   GASTROINTESTINAL A: No Acute Issues:  HEMATOLOGIC A:  No Acute Issues  INFECTIOUS A: No Acute Issues  ENDOCRINE A: No Acute Issues   NEUROLOGIC A: No Acute Issues   BEST PRACTICE / DISPOSITION Level of Care:  ICU Consultants:  None Code Status:  Full Diet:  NPO DVT Px:  SQH GI Px:  PPI Skin Integrity:  Intact Social / Family:  At bedside  TODAY'S SUMMARY:   I have personally obtained a history, examined the patient, evaluated laboratory and imaging results, formulated the assessment and plan and placed orders.  CRITICAL CARE: The patient is critically ill with multiple organ systems failure and requires high complexity decision making for assessment and support, frequent evaluation and titration of therapies, application of advanced  monitoring technologies and extensive interpretation of multiple databases. Critical Care Time devoted to patient care services described in this note is 90 minutes.   Evalyn Casco, MD Pulmonary and Critical Care Medicine [facility] ([place]) Pager: [phone]  05/13/2013, 3:06 AM

## 2013-05-13 NOTE — Progress Notes (Addendum)
INITIAL NUTRITION ASSESSMENT  DOCUMENTATION CODES Per approved criteria  -Not Applicable   INTERVENTION: Recommend initiation of nutrition support within 24-48 hours of intubation. If enteral nutrition desired, recommend initiation of Vital AF 1.2 via enteral feeding tube at 15 ml/hr, advance by 10 ml q 4 hours, to goal of 35 ml/hr. Add 30 ml Prostat liquid protein via tube daily. Goal regimen will provide: 1108 kcal, 78 grams protein, 779 ml free water. Rest of kcal needs will be met with current propofol infusion. RD to continue to follow nutrition care plan.  NUTRITION DIAGNOSIS: Inadequate oral intake related to inability to eat as evidenced by NPO status.   Goal: Initiation of nutrition support within 24-48 hours of intubation. Intake to meet >90% of estimated nutrition needs.  Monitor:  weight trends, lab trends, I/O's, vent settings, initiation of nutrition support  Reason for Assessment: MD Consult for Assessment and TF Recommendations  22 y.o. female  Admitting Dx: Respiratory failure  ASSESSMENT: PMHx significant for asthma. Admitted with SOB x 2 days. Work-up reveals acute asthma exacerbation. CXR negative for PNA. Intubated on admission.  Patient is currently intubated on ventilator support.  MV: 6.5 L/min Temp:Temp (24hrs), Avg:95.4 F (35.2 C), Min:94.2 F (34.6 C), Max:96.4 F (35.8 C)  Propofol: 7.1 ml/hr --> provides 187 kcal/d. 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  Discussed nutrition hx with mom at bedside. She notes that pt does not have any food allergies or avoid any foods for any reasons. Mom is unable to state it pt was eating well PTA, as pt lives with two friends. Pt appears well nourished.  Height: Ht Readings from Last 1 Encounters:  05/13/13 5\' 8"  (1.727 m)    Weight: Wt Readings from Last 1 Encounters:  05/13/13 130 lb (58.968 kg)    Ideal Body Weight: 154 lb  % Ideal Body Weight: 84%  Wt Readings from Last 10 Encounters:  05/13/13  130 lb (58.968 kg)    Usual Body Weight: n/a  % Usual Body Weight: n/a   BMI:  Body mass index is 19.77 kg/(m^2). WNL  Estimated Nutritional Needs: Kcal: 1312 Protein: 77 - 90 g Fluid: 1.4 - 1.6 liters  Skin: intact  Diet Order: NPO  EDUCATION NEEDS: -No education needs identified at this time   Intake/Output Summary (Last 24 hours) at 05/13/13 1018 Last data filed at 05/13/13 0900  Gross per 24 hour  Intake 516.09 ml  Output    600 ml  Net -83.91 ml    Last BM: PTA  Labs:   Recent Labs Lab 05/13/13 0045 05/13/13 0655  NA 138 139  K 3.6 3.3*  CL 104 100  CO2  --  22  BUN 5* 6  CREATININE 0.70 0.60  CALCIUM  --  8.2*  MG  --  1.9  PHOS  --  4.5  GLUCOSE 160* 167*    CBG (last 3)  No results found for this basename: GLUCAP,  in the last 72 hours  Scheduled Meds: . albuterol  2.5 mg Nebulization Q4H  . artificial tears  1 application Both Eyes Q8H  . azithromycin  500 mg Intravenous Daily  . cisatracurium  0.05 mg/kg Intravenous Once  . heparin  5,000 Units Subcutaneous Q8H  . methylPREDNISolone (SOLU-MEDROL) injection  125 mg Intravenous Q6H  . midazolam  4 mg Intravenous Once  .  morphine injection  4 mg Intravenous Once  . pantoprazole (PROTONIX) IV  40 mg Intravenous QHS  . propofol  0.5 mg/kg Intravenous Once  .  propofol  0.5 mg/kg Intravenous Once    Continuous Infusions: . cisatracurium (NIMBEX) infusion 3 mcg/kg/min (05/13/13 0809)  . fentaNYL infusion INTRAVENOUS 100 mcg/hr (05/13/13 0937)  . propofol 20 mcg/kg/min (05/13/13 0905)  .  sodium bicarbonate 150 mEq in sterile water 1000 mL infusion 125 mL/hr at 05/13/13 8469    Past Medical History  Diagnosis Date  . Asthma     History reviewed. No pertinent past surgical history.  Jarold Motto MS, RD, LDN Pager: [phone] After-hours pager: [phone]

## 2013-05-13 NOTE — Progress Notes (Signed)
[id number]/Rhonda Davis, RN, BSN, CCM [phone] Chart Reviewed for discharge and hospital needs. Discharge needs at time of review:  None Review of patient progress due on [id number]. 

## 2013-05-13 NOTE — ED Notes (Signed)
CCM at bedside 

## 2013-05-13 NOTE — ED Notes (Signed)
Anaesthesia at bedside.

## 2013-05-13 NOTE — Progress Notes (Signed)
Patient currently intubated-MyChart not activated

## 2013-05-13 NOTE — Progress Notes (Signed)
Arrived in Emergency at 3:00AM.  Spent 3.5 hours with the family until patient was moved to ICU.  The patient sole sibling, and older brother, was present along with the mother and shortly after, the mother's sister.  The patient was facing some severe responses to an asthma attack. She needed intubation and intervention for high CO2, along with some other associated issues.  Talked with mother, and gradually she became calmer and more focused on the situation.  Patient was moved to ICU approximately 6:30am, in lieu of going to [facility] (for increased resources).  All disciplines were involved in decision making processes.  BY the time she was transferred to ICU, she was making small gains and it appears they are not going to reverse.   Mother's chief concern was brain damage.  One of the doctors called in on the situation explained that it is unlikely she will have brain damage as oxygen saturation was low for a short period of time, and the are other cases of it being lower with no damage.  Mother's other concern was death, but she has a foundational faith that she latched onto.  Chaplain offered prayer, support, encouragement, companionship and input to conversations and care.  Patient's father died some time ago.  She lives with roommates, one of which was at the hospital.  Rema Jasmine, Chaplain Pager: [phone]

## 2013-05-14 ENCOUNTER — Inpatient Hospital Stay (HOSPITAL_COMMUNITY): Payer: BC Managed Care – PPO

## 2013-05-14 DIAGNOSIS — J9600 Acute respiratory failure, unspecified whether with hypoxia or hypercapnia: Secondary | ICD-10-CM

## 2013-05-14 LAB — BLOOD GAS, ARTERIAL
Acid-Base Excess: 2.5 mmol/L — ABNORMAL HIGH (ref 0.0–2.0)
Bicarbonate: 30.9 mEq/L — ABNORMAL HIGH (ref 20.0–24.0)
Drawn by: 317871
Drawn by: 295031
FIO2: 0.4 %
MECHVT: 0.5 mL
PEEP: 5 cmH2O
Patient temperature: 36.6
RATE: 14 resp/min
RATE: 14 resp/min
TCO2: 25.4 mmol/L (ref 0–100)
pCO2 arterial: 50.9 mmHg — ABNORMAL HIGH (ref 35.0–45.0)
pH, Arterial: 7.4 (ref 7.350–7.450)
pO2, Arterial: 119 mmHg — ABNORMAL HIGH (ref 80.0–100.0)

## 2013-05-14 LAB — COMPREHENSIVE METABOLIC PANEL
ALT: 10 U/L (ref 0–35)
Albumin: 3 g/dL — ABNORMAL LOW (ref 3.5–5.2)
Alkaline Phosphatase: 42 U/L (ref 39–117)
BUN: 7 mg/dL (ref 6–23)
Calcium: 8.2 mg/dL — ABNORMAL LOW (ref 8.4–10.5)
GFR calc Af Amer: >90 mL/min (ref >90)
Glucose, Bld: 124 mg/dL — ABNORMAL HIGH (ref 70–99)
Potassium: 3.2 mEq/L — ABNORMAL LOW (ref 3.5–5.1)
Sodium: 139 mEq/L (ref 135–145)
Total Protein: 6.3 g/dL (ref 6.0–8.3)

## 2013-05-14 LAB — CBC
Hemoglobin: 11 g/dL — ABNORMAL LOW (ref 12.0–15.0)
MCH: 30 pg (ref 26.0–34.0)
MCHC: 35.8 g/dL (ref 30.0–36.0)
RDW: 12.9 % (ref 11.5–15.5)

## 2013-05-14 LAB — BASIC METABOLIC PANEL
Chloride: 103 mEq/L (ref 96–112)
Creatinine, Ser: 0.61 mg/dL (ref 0.50–1.10)
GFR calc Af Amer: >90 mL/min (ref >90)
Potassium: 3.7 mEq/L (ref 3.5–5.1)
Sodium: 139 mEq/L (ref 135–145)

## 2013-05-14 LAB — LEGIONELLA ANTIGEN, URINE: Legionella Antigen, Urine: NEGATIVE

## 2013-05-14 MED ORDER — POTASSIUM CHLORIDE 10 MEQ/50ML IV SOLN
10.0000 meq | INTRAVENOUS | Status: AC
  Administered 2013-05-14 (×4): 10 meq via INTRAVENOUS
  Filled 2013-05-14: qty 50

## 2013-05-14 MED ORDER — DEXTROSE-NACL 5-0.45 % IV SOLN
INTRAVENOUS | Status: DC
  Administered 2013-05-14 – 2013-05-16 (×3): via INTRAVENOUS

## 2013-05-14 NOTE — Progress Notes (Signed)
PM ICU Rounds  Subjective: Remains on heavy sedation, paralytic, and HCO3 gtt.  Respiratory mechanics improved.  Objective: Temp:  [97.5 F (36.4 C)-100.6 F (38.1 C)] 100.6 F (38.1 C) (09/16 1300) Pulse Rate:  [89-122] 117 (09/16 1300) Resp:  [14-21] 14 (09/16 1300) BP: (105-120)/(60-71) 120/71 mmHg (09/16 0342) SpO2:  [96 %-100 %] 99 % (09/16 1300) Arterial Line BP: (75-135)/(52-72) 120/69 mmHg (09/16 1300) FiO2 (%):  [40 %] 40 % (09/16 1209) Weight:  [140 lb 10.5 oz (63.8 kg)] 140 lb 10.5 oz (63.8 kg) (09/16 0600)  CVP:  [8 mmHg-13 mmHg] 13 mmHg  Vent Mode:  [-] PRVC FiO2 (%):  [40 %] 40 % Set Rate:  [14 bmp] 14 bmp Vt Set:  [500 mL] 500 mL PEEP:  [5 cmH20] 5 cmH20 Plateau Pressure:  [25 cmH20-30 cmH20] 25 cmH20   Neuro: Sedated, paralyzed HEENT: ETT in place Cardiac: regular, tachycardic Chest: coarse b/l wheeze, better air movement Abd: soft, non tender Ext: no edema  CBC Recent Labs     05/13/13  0032  05/13/13  0045  05/13/13  0655  05/14/13  0506  WBC  15.8*   --   16.5*  15.6*  HGB  14.0  14.6  13.5  11.0*  HCT  38.8  43.0  39.3  30.7*  PLT  272   --   230  199    Coag's Recent Labs     05/13/13  0655  APTT  25  INR  1.20    BMET Recent Labs     05/13/13  0045  05/13/13  0655  05/14/13  0506  NA  138  139  139  K  3.6  3.3*  3.2*  CL  104  100  102  CO2   --   22  29  BUN  5*  6  7  CREATININE  0.70  0.60  0.54  GLUCOSE  160*  167*  124*    Electrolytes Recent Labs     05/13/13  0655  05/14/13  0506  CALCIUM  8.2*  8.2*  MG  1.9   --   PHOS  4.5   --     ABG Recent Labs     05/13/13  0525  05/13/13  1152  05/14/13  0414  PHART  7.107*  7.279*  7.388  PCO2ART  57.7*  55.7*  46.6*  PO2ART  159.0*  162.0*  118.0*    Liver Enzymes Recent Labs     05/13/13  0655  05/14/13  0506  AST  15  18  ALT  11  10  ALKPHOS  56  42  BILITOT  0.5  0.4  ALBUMIN  3.8  3.0*    Glucose Recent Labs     05/13/13  1250   GLUCAP  150*    Imaging Dg Chest Port 1 View  05/14/2013   CLINICAL DATA:  Respiratory failure. Endotracheal tube.  EXAM: PORTABLE CHEST - 1 VIEW  COMPARISON:  Chest x-ray from yesterday  FINDINGS: Endotracheal tube in the mid thoracic trachea. Enteric tube ends in the stomach. Right IJ catheter ends at the superior cavoatrial junction.  Lungs remain well aerated. No effusion or pneumothorax. Normal heart size.  IMPRESSION: 1. Good positioning of tubes and lines. 2. Clear chest.   Electronically Signed   By: Tiburcio Pea   On: 05/14/2013 06:28   Dg Chest Port 1 View  05/13/2013   *RADIOLOGY REPORT*  Clinical Data:  Right-sided central line placement  PORTABLE CHEST - 1 VIEW  Comparison: 05/13/2013 at 4:57 a.m.  Findings: Right IJ central line tip terminates 4 cm below the carina.  Heart size is normal. Endotracheal and nasogastric tubes appropriately positioned. No pleural effusion.  No acute osseous finding.  No pneumothorax.  IMPRESSION: Right IJ central line tip over distal SVC.   Original Report Authenticated By: Christiana Pellant, M.D.   Dg Chest Port 1 View  05/13/2013   *RADIOLOGY REPORT*  Clinical Data: Endotracheal tube manipulation  PORTABLE CHEST - 1 VIEW  Comparison: Prior radiograph performed earlier on the same day at 02:50 am.  Findings: The tip of the endotracheal tube is located 4.4 cm above the carina.  Enteric tube overlies the stomach.  There is been no significant interval change in the appearance of the heart and lungs.  No pneumothorax.  No frank pulmonary edema or airspace consolidation.  IMPRESSION: Tip of the endotracheal tube 4.4 cm above the carina.  Otherwise stable appearance of her lungs.   Original Report Authenticated By: Rise Mu, M.D.   Dg Chest Portable 1 View  05/13/2013   *RADIOLOGY REPORT*  Clinical Data: Endotracheal tube placement  PORTABLE CHEST - 1 VIEW  Comparison: Radiograph from 05/12/2013  Findings: There is been interval placement of an  endotracheal tube with tip located 5.2 cm above the carina.  Side hole enteric tube is well beyond the GE junction and overlies the stomach.  Cardiac and mediastinal silhouettes are unchanged and remain within normal limits.  The lungs remain clear without focal infiltrate, pulmonary edema, or pleural effusion.  No pneumothorax.  No acute osseous abnormality.  IMPRESSION: 1.  Tip of the endotracheal tube 5.2 cm above the carina. 2.  No radiographic evidence of acute cardiopulmonary process.   Original Report Authenticated By: Rise Mu, M.D.   Dg Chest Port 1 View  05/12/2013   CLINICAL DATA:  Severe shortness of breath.  EXAM: PORTABLE CHEST - 1 VIEW  COMPARISON:  No priors.  FINDINGS: Lung volumes are normal. No consolidative airspace disease. No pleural effusions. No pneumothorax. No pulmonary nodule or mass noted. Pulmonary vasculature and the cardiomediastinal silhouette are within normal limits.  IMPRESSION: 1. No radiographic evidence of acute cardiopulmonary disease.   Electronically Signed   By: Trudie Reed M.D.   On: 05/12/2013 23:57    Impression:  A: Acute hypoxic/hypercapnic respiratory failure 2nd to Acute asthma exacerbation >> improving. P: D/c HCO3 gtt, d/c nimbex Continue heavy sedation for now Continue scheduled BD's and solumedrol  CC time 35 minutes  Updated pt's family at bedside.  Coralyn Helling, MD [facility] Pulmonary/Critical Care 05/14/2013, 2:06 PM Pager:  [phone] After 3pm call: [phone]

## 2013-05-14 NOTE — Progress Notes (Signed)
PULMONARY  / CRITICAL CARE MEDICINE  Name: Catherine Morrow MRN: [id number] DOB: [birth year]    ADMISSION DATE:  05/12/2013 CONSULTATION DATE:  05/13/2013  REFERRING MD :  Dr. Nicanor Alcon PRIMARY SERVICE: PCCM  CHIEF COMPLAINT:  SOB  BRIEF PATIENT DESCRIPTION: 27 F with severe asthma exacerbation 9/14 and impending respiratory failure.  SIGNIFICANT EVENTS / STUDIES:  UDS 9/15: +Opiates, benzos, and THC  LINES / TUBES: ETT 05/13/2013>>> Right IJ CVL 9/15>>>  CULTURES 9/15 sputum: GPC cluster and chains>>> 9/15: U strep: neg 9/15 U legionella>>>  ANTIBIOTICS azithro 9/15>>>  PHYSICAL EXAM  VITAL SIGNS: Temp:  [97.5 F (36.4 C)-100.6 F (38.1 C)] 100.6 F (38.1 C) (09/16 1300) Pulse Rate:  [89-123] 117 (09/16 1300) Resp:  [14-21] 14 (09/16 1300) BP: (105-120)/(60-71) 120/71 mmHg (09/16 0342) SpO2:  [96 %-100 %] 99 % (09/16 1300) Arterial Line BP: (75-135)/(52-72) 120/69 mmHg (09/16 1300) FiO2 (%):  [40 %] 40 % (09/16 1209) Weight:  [63.8 kg (140 lb 10.5 oz)] 63.8 kg (140 lb 10.5 oz) (09/16 0600)  HEMODYNAMICS: CVP:  [8 mmHg-13 mmHg] 13 mmHg  VENTILATOR SETTINGS: Vent Mode:  [-] PRVC FiO2 (%):  [40 %] 40 % Set Rate:  [14 bmp] 14 bmp Vt Set:  [500 mL] 500 mL PEEP:  [5 cmH20] 5 cmH20 Plateau Pressure:  [25 cmH20-30 cmH20] 25 cmH20  INTAKE / OUTPUT: Intake/Output     09/15 0701 - 09/16 0700 09/16 0701 - 09/17 0700   I.V. (mL/kg) 4683.5 (73.4) 1243.2 (19.5)   NG/GT  30   IV Piggyback 300 400   Total Intake(mL/kg) 4983.5 (78.1) 1673.2 (26.2)   Urine (mL/kg/hr) 2185 (1.4) 1275 (3)   Total Output 2185 1275   Net +2798.5 +398.2          PHYSICAL EXAMINATION: General:  Sedated on vent  Neuro:  GCS 3 on NMB gtt HEENT:  Sclera anicteric, Conjunctiva Pink, orally intubated  Cardiovascular: Tachy, Reg Rhythem, NS1/S2, (-) MRG Lungs:  Diffuse wheezing w/ prolonged exp phase.  Abdomen:  S/NT/ND/(+)BS Musculoskeletal:  (-) C/C/E Skin:  (-) Rash  LABS:  CBC Recent Labs      05/13/13  0032  05/13/13  0045  05/13/13  0655  05/14/13  0506  WBC  15.8*   --   16.5*  15.6*  HGB  14.0  14.6  13.5  11.0*  HCT  38.8  43.0  39.3  30.7*  PLT  272   --   230  199    Coag's Recent Labs     05/13/13  0655  APTT  25  INR  1.20    BMET Recent Labs     05/13/13  0045  05/13/13  0655  05/14/13  0506  NA  138  139  139  K  3.6  3.3*  3.2*  CL  104  100  102  CO2   --   22  29  BUN  5*  6  7  CREATININE  0.70  0.60  0.54  GLUCOSE  160*  167*  124*    Electrolytes Recent Labs     05/13/13  0655  05/14/13  0506  CALCIUM  8.2*  8.2*  MG  1.9   --   PHOS  4.5   --     Sepsis Markers No results found for this basename: LACTICACIDVEN, PROCALCITON, O2SATVEN,  in the last 72 hours  ABG Recent Labs     05/13/13  0525  05/13/13  1152  05/14/13  0414  PHART  7.107*  7.279*  7.388  PCO2ART  57.7*  55.7*  46.6*  PO2ART  159.0*  162.0*  118.0*    Liver Enzymes Recent Labs     05/13/13  0655  05/14/13  0506  AST  15  18  ALT  11  10  ALKPHOS  56  42  BILITOT  0.5  0.4  ALBUMIN  3.8  3.0*    Cardiac Enzymes No results found for this basename: TROPONINI, PROBNP,  in the last 72 hours  Glucose Recent Labs     05/13/13  1250  GLUCAP  150*    Imaging Dg Chest Port 1 View  05/14/2013   CLINICAL DATA:  Respiratory failure. Endotracheal tube.  EXAM: PORTABLE CHEST - 1 VIEW  COMPARISON:  Chest x-ray from yesterday  FINDINGS: Endotracheal tube in the mid thoracic trachea. Enteric tube ends in the stomach. Right IJ catheter ends at the superior cavoatrial junction.  Lungs remain well aerated. No effusion or pneumothorax. Normal heart size.  IMPRESSION: 1. Good positioning of tubes and lines. 2. Clear chest.   Electronically Signed   By: Tiburcio Pea   On: 05/14/2013 06:28   Dg Chest Port 1 View  05/13/2013   *RADIOLOGY REPORT*  Clinical Data: Right-sided central line placement  PORTABLE CHEST - 1 VIEW  Comparison: 05/13/2013 at 4:57 a.m.   Findings: Right IJ central line tip terminates 4 cm below the carina.  Heart size is normal. Endotracheal and nasogastric tubes appropriately positioned. No pleural effusion.  No acute osseous finding.  No pneumothorax.  IMPRESSION: Right IJ central line tip over distal SVC.   Original Report Authenticated By: Christiana Pellant, M.D.   Dg Chest Port 1 View  05/13/2013   *RADIOLOGY REPORT*  Clinical Data: Endotracheal tube manipulation  PORTABLE CHEST - 1 VIEW  Comparison: Prior radiograph performed earlier on the same day at 02:50 am.  Findings: The tip of the endotracheal tube is located 4.4 cm above the carina.  Enteric tube overlies the stomach.  There is been no significant interval change in the appearance of the heart and lungs.  No pneumothorax.  No frank pulmonary edema or airspace consolidation.  IMPRESSION: Tip of the endotracheal tube 4.4 cm above the carina.  Otherwise stable appearance of her lungs.   Original Report Authenticated By: Rise Mu, M.D.   Dg Chest Portable 1 View  05/13/2013   *RADIOLOGY REPORT*  Clinical Data: Endotracheal tube placement  PORTABLE CHEST - 1 VIEW  Comparison: Radiograph from 05/12/2013  Findings: There is been interval placement of an endotracheal tube with tip located 5.2 cm above the carina.  Side hole enteric tube is well beyond the GE junction and overlies the stomach.  Cardiac and mediastinal silhouettes are unchanged and remain within normal limits.  The lungs remain clear without focal infiltrate, pulmonary edema, or pleural effusion.  No pneumothorax.  No acute osseous abnormality.  IMPRESSION: 1.  Tip of the endotracheal tube 5.2 cm above the carina. 2.  No radiographic evidence of acute cardiopulmonary process.   Original Report Authenticated By: Rise Mu, M.D.   Dg Chest Port 1 View  05/12/2013   CLINICAL DATA:  Severe shortness of breath.  EXAM: PORTABLE CHEST - 1 VIEW  COMPARISON:  No priors.  FINDINGS: Lung volumes are normal. No  consolidative airspace disease. No pleural effusions. No pneumothorax. No pulmonary nodule or mass noted. Pulmonary vasculature and the cardiomediastinal silhouette are within normal limits.  IMPRESSION:  1. No radiographic evidence of acute cardiopulmonary disease.   Electronically Signed   By: Trudie Reed M.D.   On: 05/12/2013 23:57    EKG: None CXR: ETT in good position, lungs w/out infiltrate   ASSESSMENT / PLAN: Principal Problem:   Respiratory failure Active Problems:   Asthma exacerbation   PULMONARY A: Acute Hypoxic and hypercapneic Respiratory Failure 2/2 Asthma Exacerbation/ status asthmaticus  Unclear, 2 d of progressive dyspnea, but also mom notes about 3 weeks ago suddenly needed SABA (since school started back). She was THC positive  >9/16 afternoon, vent mechanics and gas exchange improving   P:   Cont full vent support  Will try off NMB Solumedrol 125 mg q 6 (hold here, taper in am 9/17) Cont aggressive SABA  f/u viral panel See ID section Cont neb D/c bicarb gtt  Close obs of abg AND gas exchange  On Discharge needs smoking cessation counseling and PCP   CARDIOVASCULAR A:  Hypotension. Resolved w/ IVFs, exacerbated by sedating gtts.  P: Cont IVFs-->keep even vol status  Cont tele   RENAL A: Hypokalemia  P: Replace and recheck   GASTROINTESTINAL A: No Acute Issues: P: PPI and [place] heparin  Will start nutrition in next 24h   HEMATOLOGIC A:  No Acute Issues  INFECTIOUS A: No Acute Issues P:  Empiric azithro for asthmatic exacerbation   ENDOCRINE A: Steroid induced hyperglycemia  P: ssi protocol   NEUROLOGIC A: No Acute Issues, sedated as on NMB gtt P: Supportive care    I have personally obtained a history, examined the patient, evaluated laboratory and imaging results, formulated the assessment and plan and placed orders.  CRITICAL CARE: The patient is critically ill with multiple organ systems failure and requires high  complexity decision making for assessment and support, frequent evaluation and titration of therapies, application of advanced monitoring technologies and extensive interpretation of multiple databases. Critical Care Time devoted to patient care services described in this note is 35 minutes.

## 2013-05-14 NOTE — Progress Notes (Signed)
[facility] ADULT ICU REPLACEMENT PROTOCOL FOR AM LAB REPLACEMENT ONLY  The patient does apply for the [facility] Adult ICU Electrolyte Replacment Protocol based on the criteria listed below:   1. Is GFR >/= 40 ml/min? yes  Patient's GFR today is >90 2. Is urine output >/= 0.5 ml/kg/hr for the last 6 hours? yes Patient's UOP is 1.38 ml/kg/hr 3. Is BUN < 60 mg/dL? yes  Patient's BUN today is 7 4. Abnormal electrolyte(s)K 3.2 5. Ordered repletion with: per protocol 6. If a panic level lab has been reported, has the CCM MD in charge been notified? yes.   Physician:  E Deterding  [place] 05/14/2013 5:53 AM

## 2013-05-14 NOTE — Progress Notes (Signed)
PULMONARY  / CRITICAL CARE MEDICINE  Name: Catherine Morrow MRN: [id number] DOB: 05-23-1991    ADMISSION DATE:  05/12/2013 CONSULTATION DATE:  05/13/2013  REFERRING MD :  Dr. Nicanor Alcon PRIMARY SERVICE: PCCM  CHIEF COMPLAINT:  SOB  BRIEF PATIENT DESCRIPTION: 58 F with severe asthma exacerbation 9/14 and impending respiratory failure.  SIGNIFICANT EVENTS / STUDIES:  UDS 9/15: +Opiates, benzos, and THC  LINES / TUBES: ETT 05/13/2013>>>  CULTURES 9/15 sputum >>> 9/15: U strep: neg 9/15 U legionella>>>  ANTIBIOTICS azithro 9/15>>>  PHYSICAL EXAM  VITAL SIGNS: Temp:  [95.5 F (35.3 C)-100.2 F (37.9 C)] 98.4 F (36.9 C) (09/16 0600) Pulse Rate:  [92-132] 92 (09/16 0600) Resp:  [13-21] 14 (09/16 0600) BP: (105-120)/(60-71) 120/71 mmHg (09/16 0342) SpO2:  [100 %] 100 % (09/16 0600) Arterial Line BP: (74-135)/(43-72) 135/69 mmHg (09/16 0600) FiO2 (%):  [40 %-50 %] 40 % (09/16 0352) Weight:  [63.8 kg (140 lb 10.5 oz)] 63.8 kg (140 lb 10.5 oz) (09/16 0600)  HEMODYNAMICS: CVP:  [8 mmHg-11 mmHg] 8 mmHg  VENTILATOR SETTINGS: Vent Mode:  [-] PRVC FiO2 (%):  [40 %-50 %] 40 % Set Rate:  [14 bmp] 14 bmp Vt Set:  [500 mL] 500 mL PEEP:  [5 cmH20] 5 cmH20 Plateau Pressure:  [25 cmH20-30 cmH20] 25 cmH20  INTAKE / OUTPUT: Intake/Output     09/15 0701 - 09/16 0700 09/16 0701 - 09/17 0700   I.V. (mL/kg) 4474.9 (70.1)    IV Piggyback 250    Total Intake(mL/kg) 4724.9 (74.1)    Urine (mL/kg/hr) 2185 (1.4)    Total Output 2185     Net +2539.9            PHYSICAL EXAMINATION: General:  Sedated on vent  Neuro:  GCS 3 on NMB gtt HEENT:  Sclera anicteric, Conjunctiva Pink, orally intubated  Cardiovascular: Tachy, Reg Rhythem, NS1/S2, (-) MRG Lungs:  Diffuse wheezing w/ prolonged exp phase.  Abdomen:  S/NT/ND/(+)BS Musculoskeletal:  (-) C/C/E Skin:  (-) Rash  LABS:  CBC Recent Labs     05/13/13  0032  05/13/13  0045  05/13/13  0655  05/14/13  0506  WBC  15.8*   --   16.5*   15.6*  HGB  14.0  14.6  13.5  11.0*  HCT  38.8  43.0  39.3  30.7*  PLT  272   --   230  199    Coag's Recent Labs     05/13/13  0655  APTT  25  INR  1.20    BMET Recent Labs     05/13/13  0045  05/13/13  0655  05/14/13  0506  NA  138  139  139  K  3.6  3.3*  3.2*  CL  104  100  102  CO2   --   22  29  BUN  5*  6  7  CREATININE  0.70  0.60  0.54  GLUCOSE  160*  167*  124*    Electrolytes Recent Labs     05/13/13  0655  05/14/13  0506  CALCIUM  8.2*  8.2*  MG  1.9   --   PHOS  4.5   --     Sepsis Markers No results found for this basename: LACTICACIDVEN, PROCALCITON, O2SATVEN,  in the last 72 hours  ABG Recent Labs     05/13/13  0525  05/13/13  1152  05/14/13  0414  PHART  7.107*  7.279*  7.388  PCO2ART  57.7*  55.7*  46.6*  PO2ART  159.0*  162.0*  118.0*    Liver Enzymes Recent Labs     05/13/13  0655  05/14/13  0506  AST  15  18  ALT  11  10  ALKPHOS  56  42  BILITOT  0.5  0.4  ALBUMIN  3.8  3.0*    Cardiac Enzymes No results found for this basename: TROPONINI, PROBNP,  in the last 72 hours  Glucose Recent Labs     05/13/13  1250  GLUCAP  150*    Imaging Dg Chest Port 1 View  05/14/2013   CLINICAL DATA:  Respiratory failure. Endotracheal tube.  EXAM: PORTABLE CHEST - 1 VIEW  COMPARISON:  Chest x-ray from yesterday  FINDINGS: Endotracheal tube in the mid thoracic trachea. Enteric tube ends in the stomach. Right IJ catheter ends at the superior cavoatrial junction.  Lungs remain well aerated. No effusion or pneumothorax. Normal heart size.  IMPRESSION: 1. Good positioning of tubes and lines. 2. Clear chest.   Electronically Signed   By: Tiburcio Pea   On: 05/14/2013 06:28   Dg Chest Port 1 View  05/13/2013   *RADIOLOGY REPORT*  Clinical Data: Right-sided central line placement  PORTABLE CHEST - 1 VIEW  Comparison: 05/13/2013 at 4:57 a.m.  Findings: Right IJ central line tip terminates 4 cm below the carina.  Heart size is normal.  Endotracheal and nasogastric tubes appropriately positioned. No pleural effusion.  No acute osseous finding.  No pneumothorax.  IMPRESSION: Right IJ central line tip over distal SVC.   Original Report Authenticated By: Christiana Pellant, M.D.   Dg Chest Port 1 View  05/13/2013   *RADIOLOGY REPORT*  Clinical Data: Endotracheal tube manipulation  PORTABLE CHEST - 1 VIEW  Comparison: Prior radiograph performed earlier on the same day at 02:50 am.  Findings: The tip of the endotracheal tube is located 4.4 cm above the carina.  Enteric tube overlies the stomach.  There is been no significant interval change in the appearance of the heart and lungs.  No pneumothorax.  No frank pulmonary edema or airspace consolidation.  IMPRESSION: Tip of the endotracheal tube 4.4 cm above the carina.  Otherwise stable appearance of her lungs.   Original Report Authenticated By: Rise Mu, M.D.   Dg Chest Portable 1 View  05/13/2013   *RADIOLOGY REPORT*  Clinical Data: Endotracheal tube placement  PORTABLE CHEST - 1 VIEW  Comparison: Radiograph from 05/12/2013  Findings: There is been interval placement of an endotracheal tube with tip located 5.2 cm above the carina.  Side hole enteric tube is well beyond the GE junction and overlies the stomach.  Cardiac and mediastinal silhouettes are unchanged and remain within normal limits.  The lungs remain clear without focal infiltrate, pulmonary edema, or pleural effusion.  No pneumothorax.  No acute osseous abnormality.  IMPRESSION: 1.  Tip of the endotracheal tube 5.2 cm above the carina. 2.  No radiographic evidence of acute cardiopulmonary process.   Original Report Authenticated By: Rise Mu, M.D.   Dg Chest Port 1 View  05/12/2013   CLINICAL DATA:  Severe shortness of breath.  EXAM: PORTABLE CHEST - 1 VIEW  COMPARISON:  No priors.  FINDINGS: Lung volumes are normal. No consolidative airspace disease. No pleural effusions. No pneumothorax. No pulmonary nodule or  mass noted. Pulmonary vasculature and the cardiomediastinal silhouette are within normal limits.  IMPRESSION: 1. No radiographic evidence of acute cardiopulmonary disease.   Electronically Signed  By: Trudie Reed M.D.   On: 05/12/2013 23:57    EKG: None CXR: ETT in good position, lungs w/out infiltrate   ASSESSMENT / PLAN: Principal Problem:   Respiratory failure Active Problems:   Asthma exacerbation   PULMONARY A: Acute Hypoxic and hypercapneic Respiratory Failure 2/2 Asthma Exacerbation Unclear, 2 d of progressive dyspnea, but also mom notes about 3 weeks ago suddenly needed SABA (since school started back). She was THC positive   P:   Gentle mechanical ventilation, long E time Paralysis x another 24 hours and reassess Solumedrol 125 mg q 6 (hold here) q 6 nebs standing, q 2 PRN f/u viral panel See ID section Cont neb Ck UDS Close obs of abg AND gas exchange  On Discharge needs smoking cessation counseling and PCP   CARDIOVASCULAR A: Hypotension. Resolved w/ IVFs, exacerbated by sedating gtts.  P: Cont IVFs Cont tele  RENAL A: Hypokalemia  P: Replace and recheck   GASTROINTESTINAL A: No Acute Issues: P: PPI and [place] heparin  Will start nutrition in next 24h, place NGT now   HEMATOLOGIC A:  No Acute Issues  INFECTIOUS A: No Acute Issues P:  Empiric azithro for asthmatic exacerbation   ENDOCRINE A: Steroid induced hyperglycemia  P: ssi protocol   NEUROLOGIC A: No Acute Issues, sedated as on NMB gtt P: Supportive care    I have personally obtained a history, examined the patient, evaluated laboratory and imaging results, formulated the assessment and plan and placed orders.  CRITICAL CARE: The patient is critically ill with multiple organ systems failure and requires high complexity decision making for assessment and support, frequent evaluation and titration of therapies, application of advanced monitoring technologies and extensive  interpretation of multiple databases. Critical Care Time devoted to patient care services described in this note is 35 minutes.    Levy Pupa, MD, PhD 05/14/2013, 12:11 PM [place] [facility] [phone] or if no answer [phone]

## 2013-05-15 ENCOUNTER — Inpatient Hospital Stay (HOSPITAL_COMMUNITY): Payer: BC Managed Care – PPO

## 2013-05-15 DIAGNOSIS — J45909 Unspecified asthma, uncomplicated: Secondary | ICD-10-CM

## 2013-05-15 DIAGNOSIS — J45902 Unspecified asthma with status asthmaticus: Secondary | ICD-10-CM

## 2013-05-15 DIAGNOSIS — R0609 Other forms of dyspnea: Secondary | ICD-10-CM

## 2013-05-15 LAB — BASIC METABOLIC PANEL
CO2: 29 mEq/L (ref 19–32)
Calcium: 8.1 mg/dL — ABNORMAL LOW (ref 8.4–10.5)
Chloride: 107 mEq/L (ref 96–112)
Potassium: 3.8 mEq/L (ref 3.5–5.1)
Sodium: 141 mEq/L (ref 135–145)

## 2013-05-15 LAB — LACTIC ACID, PLASMA: Lactic Acid, Venous: 0.9 mmol/L (ref 0.5–2.2)

## 2013-05-15 LAB — CBC
Hemoglobin: 10.4 g/dL — ABNORMAL LOW (ref 12.0–15.0)
MCV: 85.8 fL (ref 78.0–100.0)
Platelets: 205 10*3/uL (ref 150–400)
RBC: 3.58 MIL/uL — ABNORMAL LOW (ref 3.87–5.11)
WBC: 16.1 10*3/uL — ABNORMAL HIGH (ref 4.0–10.5)

## 2013-05-15 LAB — CULTURE, RESPIRATORY W GRAM STAIN

## 2013-05-15 LAB — BLOOD GAS, ARTERIAL
Acid-Base Excess: 1.9 mmol/L (ref 0.0–2.0)
MECHVT: 0.5 mL
Patient temperature: 37.9
RATE: 14 resp/min
TCO2: 24 mmol/L (ref 0–100)
pH, Arterial: 7.395 (ref 7.350–7.450)

## 2013-05-15 LAB — GLUCOSE, CAPILLARY
Glucose-Capillary: 124 mg/dL — ABNORMAL HIGH (ref 70–99)
Glucose-Capillary: 127 mg/dL — ABNORMAL HIGH (ref 70–99)

## 2013-05-15 MED ORDER — VITAL AF 1.2 CAL PO LIQD
480.0000 mL | Freq: Two times a day (BID) | ORAL | Status: DC
  Filled 2013-05-15: qty 711

## 2013-05-15 MED ORDER — PANTOPRAZOLE SODIUM 40 MG PO PACK
40.0000 mg | PACK | Freq: Every day | ORAL | Status: DC
  Administered 2013-05-15 – 2013-05-16 (×2): 40 mg
  Filled 2013-05-15 (×3): qty 20

## 2013-05-15 MED ORDER — ALBUTEROL SULFATE (5 MG/ML) 0.5% IN NEBU
2.5000 mg | INHALATION_SOLUTION | RESPIRATORY_TRACT | Status: DC
  Administered 2013-05-15 – 2013-05-17 (×15): 2.5 mg via RESPIRATORY_TRACT
  Filled 2013-05-15 (×15): qty 0.5

## 2013-05-15 MED ORDER — SODIUM CHLORIDE 0.9 % IV SOLN
400.0000 mg | Freq: Once | INTRAVENOUS | Status: AC
  Administered 2013-05-15: 400 mg via INTRAVENOUS
  Filled 2013-05-15: qty 4

## 2013-05-15 MED ORDER — VITAL AF 1.2 CAL PO LIQD
1000.0000 mL | ORAL | Status: DC
  Filled 2013-05-15: qty 1000

## 2013-05-15 MED ORDER — VITAL AF 1.2 CAL PO LIQD
711.0000 mL | Freq: Two times a day (BID) | ORAL | Status: DC
  Administered 2013-05-15: 711 mL
  Filled 2013-05-15 (×3): qty 711

## 2013-05-15 MED ORDER — METHYLPREDNISOLONE SODIUM SUCC 125 MG IJ SOLR
80.0000 mg | Freq: Four times a day (QID) | INTRAMUSCULAR | Status: DC
  Administered 2013-05-15 – 2013-05-16 (×4): 80 mg via INTRAVENOUS
  Filled 2013-05-15 (×8): qty 1.28

## 2013-05-15 NOTE — Progress Notes (Signed)
Pt was awake at 5:00 and reaching for ETT. She was able to nod and give thumbs up to questions from her mom.  She began to get over stimulated and started coughing. Sedation medication increased. Respiratory called to check settings on vent.

## 2013-05-15 NOTE — Progress Notes (Signed)
eLink Physician-Brief Progress Note Patient Name: Catherine Morrow DOB: 1990-12-11 MRN: [id number]  Date of Service  05/15/2013   HPI/Events of Note  Febrile  eICU Interventions  One dose ibuprofen ordered   Intervention Category Minor Interventions: Routine modifications to care plan (e.g. PRN medications for pain, fever)  Shan Levans 05/15/2013, 8:34 PM

## 2013-05-15 NOTE — Progress Notes (Signed)
Patient oxygen saturation dropped after suctioning to 83%.  Pt coughing and gagging and causing PIP to increase and mechanical breaths not be delivered.  RN at bedside and giving IV medication.  RT continued to give patient 100% until patients SPO2 came up to 97%.  RT will continue to monitor.

## 2013-05-15 NOTE — Progress Notes (Addendum)
NUTRITION FOLLOW-UP/CONSULT  INTERVENTION: Initiate Vital AF 1.2 via enteral feeding tube at 20 ml/hr, advance by 10 ml q 4 hours, to goal of 50 ml/hr. Goal regimen will provide: 1440 kcal, 90 grams protein, 973 ml free water. Rest of kcal needs will be met with current propofol infusion. RD to continue to follow nutrition care plan.  NUTRITION DIAGNOSIS: Inadequate oral intake related to inability to eat as evidenced by NPO status. Ongoing.  Goal: Intake to meet >90% of estimated nutrition needs. Unmet.  Monitor:  weight trends, lab trends, I/O's, vent settings, initiation of nutrition support  ESSMENT: PMHx significant for asthma. Admitted with SOB x 2 days. Work-up reveals acute asthma exacerbation. CXR negative for PNA. Intubated on admission.  Patient is currently intubated on ventilator support.  MV: 7 L/min Temp:Temp (24hrs), Avg:99.9 F (37.7 C), Min:98.8 F (37.1 C), Max:100.8 F (38.2 C)  Propofol: 10.6 ml/hr --> provides 280 kcal/d.   Discussed nutrition management with Anders Simmonds, NP for CCM. RD to manage feedings. Pt with post-pyloric tube.  Height: Ht Readings from Last 1 Encounters:  05/13/13 5\' 7"  (1.702 m)    Weight: Wt Readings from Last 1 Encounters:  05/15/13 141 lb 15.6 oz (64.4 kg)  Admit wt 130 lb.  BMI:  Body mass index is 22.23 kg/(m^2). WNL  Estimated Nutritional Needs: Kcal: 1714 Protein: 77 - 90 g Fluid: 1.4 - 1.6 liters  Skin: intact  Diet Order: NPO  EDUCATION NEEDS: -No education needs identified at this time   Intake/Output Summary (Last 24 hours) at 05/15/13 1142 Last data filed at 05/15/13 1000  Gross per 24 hour  Intake 4078.49 ml  Output   3300 ml  Net 778.49 ml    Last BM: PTA  Labs:   Recent Labs Lab 05/13/13 0045  05/13/13 0655 05/14/13 0506 05/14/13 1700 05/15/13 0544  NA 138  --  139 139 139 141  K 3.6  --  3.3* 3.2* 3.7 3.8  CL 104  --  100 102 103 107  CO2  --   < > 22 29 30 29   BUN 5*  --  6 7  10 11   CREATININE 0.70  --  0.60 0.54 0.61 0.52  CALCIUM  --   < > 8.2* 8.2* 8.3* 8.1*  MG  --   --  1.9  --   --   --   PHOS  --   --  4.5  --   --   --   GLUCOSE 160*  --  167* 124* 140* 123*  < > = values in this interval not displayed.  CBG (last 3)   Recent Labs  05/13/13 1250  GLUCAP 150*    Scheduled Meds: . albuterol  2.5 mg Nebulization Q3H  . antiseptic oral rinse  15 mL Mouth Rinse QID  . artificial tears  1 application Both Eyes Q8H  . azithromycin  500 mg Intravenous Daily  . chlorhexidine  15 mL Mouth Rinse BID  . feeding supplement (VITAL AF 1.2 CAL)  1,000 mL Per Tube Q24H  . heparin  5,000 Units Subcutaneous Q8H  . methylPREDNISolone (SOLU-MEDROL) injection  80 mg Intravenous Q6H  . midazolam  4 mg Intravenous Once  .  morphine injection  4 mg Intravenous Once  . pantoprazole sodium  40 mg Per Tube Q1200  . propofol  0.5 mg/kg Intravenous Once  . propofol  0.5 mg/kg Intravenous Once    Continuous Infusions: . dextrose 5 % and 0.45% NaCl  50 mL/hr at 05/15/13 1133  . fentaNYL infusion INTRAVENOUS 200 mcg/hr (05/15/13 1103)  . propofol 30 mcg/kg/min (05/15/13 1112)   Jarold Motto MS, RD, LDN Pager: [phone] After-hours pager: [phone]

## 2013-05-15 NOTE — Progress Notes (Signed)
eLink Physician-Brief Progress Note Patient Name: Catherine Morrow DOB: 1991/01/24 MRN: [id number]  Date of Service  05/15/2013   HPI/Events of Note   Febrile 100.66F  eICU Interventions  Blood culture uirine culture Lactic acid check PCT check Continue Azithro for now; reassess need by bedside MD      Etienne Millward 05/15/2013, 5:40 AM

## 2013-05-15 NOTE — Progress Notes (Signed)
PULMONARY  / CRITICAL CARE MEDICINE  Name: Catherine Morrow MRN: [id number] DOB: 1991/03/21    ADMISSION DATE:  05/12/2013 CONSULTATION DATE:  05/13/2013  REFERRING MD :  Dr. Nicanor Alcon PRIMARY SERVICE: PCCM  CHIEF COMPLAINT:  SOB  BRIEF PATIENT DESCRIPTION: 82 F with severe asthma exacerbation 9/14 and impending respiratory failure.  SIGNIFICANT EVENTS / STUDIES:  UDS 9/15: +Opiates, benzos, and THC  LINES / TUBES: ETT 05/13/2013>>> Right IJ CVL 9/15>>>  CULTURES 9/15 sputum: GPC cluster and chains>>> 9/15: U strep: neg 9/15 U legionella>>> 9/16 blood >>  9/16 urine >>   ANTIBIOTICS azithro 9/15>>>  PHYSICAL EXAM  VITAL SIGNS: Temp:  [98.8 F (37.1 C)-100.8 F (38.2 C)] 100.6 F (38.1 C) (09/17 0913) Pulse Rate:  [73-122] 103 (09/17 0913) Resp:  [10-16] 16 (09/17 0913) BP: (116-131)/(48-64) 119/53 mmHg (09/17 0745) SpO2:  [94 %-100 %] 97 % (09/17 0913) Arterial Line BP: (103-137)/(55-72) 129/66 mmHg (09/17 0913) FiO2 (%):  [30 %-40 %] 30 % (09/17 0800)  HEMODYNAMICS: CVP:  [5 mmHg-13 mmHg] 11 mmHg  VENTILATOR SETTINGS: Vent Mode:  [-] PRVC FiO2 (%):  [30 %-40 %] 30 % Set Rate:  [14 bmp] 14 bmp Vt Set:  [500 mL] 500 mL PEEP:  [5 cmH20] 5 cmH20 Plateau Pressure:  [23 cmH20-28 cmH20] 26 cmH20  INTAKE / OUTPUT: Intake/Output     09/16 0701 - 09/17 0700 09/17 0701 - 09/18 0700   I.V. (mL/kg) 4155.4 (65.1) 163.6 (2.6)   NG/GT 70 30   IV Piggyback 400    Total Intake(mL/kg) 4625.4 (72.5) 193.6 (3)   Urine (mL/kg/hr) 3725 (2.4) 200 (1.2)   Emesis/NG output 250 (0.2)    Total Output 3975 200   Net +650.4 -6.4          PHYSICAL EXAMINATION: General:  Sedated on vent  Neuro:  GCS 3 while on sedation  HEENT:  Sclera anicteric, Conjunctiva Pink, orally intubated  Cardiovascular: Tachy, Reg Rhythem, NS1/S2, (-) MRG Lungs:  More coarse wheezes, improving air movement Abdomen:  S/NT/ND/(+)BS Musculoskeletal:  (-) C/C/E Skin:  (-) Rash  LABS:  CBC Recent Labs      05/13/13  0655  05/14/13  0506  05/15/13  0544  WBC  16.5*  15.6*  16.1*  HGB  13.5  11.0*  10.4*  HCT  39.3  30.7*  30.7*  PLT  230  199  205    Coag's Recent Labs     05/13/13  0655  APTT  25  INR  1.20    BMET Recent Labs     05/14/13  0506  05/14/13  1700  05/15/13  0544  NA  139  139  141  K  3.2*  3.7  3.8  CL  102  103  107  CO2  29  30  29   BUN  7  10  11   CREATININE  0.54  0.61  0.52  GLUCOSE  124*  140*  123*    Electrolytes Recent Labs     05/13/13  0655  05/14/13  0506  05/14/13  1700  05/15/13  0544  CALCIUM  8.2*  8.2*  8.3*  8.1*  MG  1.9   --    --    --   PHOS  4.5   --    --    --     Sepsis Markers Recent Labs     05/15/13  0544  PROCALCITON  0.74    ABG Recent Labs  05/14/13  0414  05/14/13  1405  05/15/13  0438  PHART  7.388  7.400  7.395  PCO2ART  46.6*  50.9*  44.3  PO2ART  118.0*  119.0*  123.0*    Liver Enzymes Recent Labs     05/13/13  0655  05/14/13  0506  AST  15  18  ALT  11  10  ALKPHOS  56  42  BILITOT  0.5  0.4  ALBUMIN  3.8  3.0*    Cardiac Enzymes No results found for this basename: TROPONINI, PROBNP,  in the last 72 hours  Glucose Recent Labs     05/13/13  1250  GLUCAP  150*    Imaging Dg Chest Port 1 View  05/15/2013   CLINICAL DATA:  Endotracheal tube placement  EXAM: PORTABLE CHEST - 1 VIEW  COMPARISON:  05/14/2013  FINDINGS: Endotracheal tube in good position. Right jugular catheter tip at the cavoatrial junction. No pneumothorax.  Minimal left lower lobe airspace disease has developed since the prior study. Right lung is clear. No effusion.  IMPRESSION: Support lines in good position.  Minimal left lower lobe airspace disease   Electronically Signed   By: Marlan Palau M.D.   On: 05/15/2013 07:32   Dg Chest Port 1 View  05/14/2013   CLINICAL DATA:  Respiratory failure. Endotracheal tube.  EXAM: PORTABLE CHEST - 1 VIEW  COMPARISON:  Chest x-ray from yesterday  FINDINGS:  Endotracheal tube in the mid thoracic trachea. Enteric tube ends in the stomach. Right IJ catheter ends at the superior cavoatrial junction.  Lungs remain well aerated. No effusion or pneumothorax. Normal heart size.  IMPRESSION: 1. Good positioning of tubes and lines. 2. Clear chest.   Electronically Signed   By: Tiburcio Pea   On: 05/14/2013 06:28   Dg Chest Port 1 View  05/13/2013   *RADIOLOGY REPORT*  Clinical Data: Right-sided central line placement  PORTABLE CHEST - 1 VIEW  Comparison: 05/13/2013 at 4:57 a.m.  Findings: Right IJ central line tip terminates 4 cm below the carina.  Heart size is normal. Endotracheal and nasogastric tubes appropriately positioned. No pleural effusion.  No acute osseous finding.  No pneumothorax.  IMPRESSION: Right IJ central line tip over distal SVC.   Original Report Authenticated By: Christiana Pellant, M.D.    EKG: None CXR: ETT in good position, lungs w/out infiltrate   ASSESSMENT / PLAN: Principal Problem:   Respiratory failure Active Problems:   Asthma exacerbation   PULMONARY A: Acute Hypoxic and hypercapneic Respiratory Failure 2/2 Asthma Exacerbation/ status asthmaticus  Unclear, 2 d of progressive dyspnea, but also mom notes about 3 weeks ago suddenly needed SABA (since school started back). She was THC positive  >9/16 afternoon, vent mechanics and gas exchange improving  Looking better daily  P:   Cont full vent support, hope to wean soon Daily wake up  Cont solumedrol, begin taper 9/17 See ID section Cont neb will change to q3 SABA Close obs of abg AND gas exchange  On Discharge needs smoking cessation counseling and PCP   CARDIOVASCULAR A:  Hypotension. Resolved w/ IVFs, exacerbated by sedating gtts.  P: Cont IVFs-->keep even vol status  Cont tele   RENAL A:  Hypokalemia: resolved P: Continue to monitor with daily labs and replace per protocol.    GASTROINTESTINAL A: No Acute Issues: P: PPI and [place] heparin  Will start  nutrition   HEMATOLOGIC A:  No Acute Issues  INFECTIOUS A: Low grade fevers P:  Empiric azithro for asthmatic exacerbation  Follow cx's performed 9/17 Follow Pct   ENDOCRINE A: Steroid induced hyperglycemia  P: ssi protocol   NEUROLOGIC A: Sedation and paralysis, NMB gtt discontinued 9/16. P: Supportive care    I have personally obtained a history, examined the patient, evaluated laboratory and imaging results, formulated the assessment and plan and placed orders.  CRITICAL CARE: The patient is critically ill with multiple organ systems failure and requires high complexity decision making for assessment and support, frequent evaluation and titration of therapies, application of advanced monitoring technologies and extensive interpretation of multiple databases. Critical Care Time devoted to patient care services described in this note is 40 minutes.    Levy Pupa, MD, PhD 05/15/2013, 11:30 AM [place] [facility] [phone] or if no answer [phone]

## 2013-05-16 ENCOUNTER — Inpatient Hospital Stay (HOSPITAL_COMMUNITY): Payer: BC Managed Care – PPO

## 2013-05-16 DIAGNOSIS — M7989 Other specified soft tissue disorders: Secondary | ICD-10-CM

## 2013-05-16 LAB — COMPREHENSIVE METABOLIC PANEL
Albumin: 2.7 g/dL — ABNORMAL LOW (ref 3.5–5.2)
BUN: 16 mg/dL (ref 6–23)
Calcium: 8.5 mg/dL (ref 8.4–10.5)
Creatinine, Ser: 0.5 mg/dL (ref 0.50–1.10)
Total Bilirubin: 0.2 mg/dL — ABNORMAL LOW (ref 0.3–1.2)
Total Protein: 6.3 g/dL (ref 6.0–8.3)

## 2013-05-16 LAB — GLUCOSE, CAPILLARY
Glucose-Capillary: 121 mg/dL — ABNORMAL HIGH (ref 70–99)
Glucose-Capillary: 120 mg/dL — ABNORMAL HIGH (ref 70–99)

## 2013-05-16 LAB — CBC
HCT: 31.3 % — ABNORMAL LOW (ref 36.0–46.0)
MCH: 28.7 pg (ref 26.0–34.0)
MCHC: 33.2 g/dL (ref 30.0–36.0)
MCV: 86.5 fL (ref 78.0–100.0)
Platelets: 216 10*3/uL (ref 150–400)
RDW: 13 % (ref 11.5–15.5)

## 2013-05-16 MED ORDER — BIOTENE DRY MOUTH MT LIQD
15.0000 mL | Freq: Two times a day (BID) | OROMUCOSAL | Status: DC
  Administered 2013-05-17 – 2013-05-19 (×5): 15 mL via OROMUCOSAL

## 2013-05-16 MED ORDER — POTASSIUM CHLORIDE 20 MEQ/15ML (10%) PO LIQD
40.0000 meq | Freq: Once | ORAL | Status: AC
  Administered 2013-05-16: 40 meq
  Filled 2013-05-16: qty 30

## 2013-05-16 MED ORDER — ENOXAPARIN SODIUM 80 MG/0.8ML ~~LOC~~ SOLN
70.0000 mg | Freq: Once | SUBCUTANEOUS | Status: AC
  Administered 2013-05-16: 70 mg via SUBCUTANEOUS
  Filled 2013-05-16: qty 0.8

## 2013-05-16 MED ORDER — ONDANSETRON HCL 4 MG/2ML IJ SOLN
4.0000 mg | Freq: Four times a day (QID) | INTRAMUSCULAR | Status: DC | PRN
  Administered 2013-05-16 – 2013-05-17 (×2): 4 mg via INTRAVENOUS
  Filled 2013-05-16 (×2): qty 2

## 2013-05-16 MED ORDER — METHYLPREDNISOLONE SODIUM SUCC 125 MG IJ SOLR
80.0000 mg | Freq: Two times a day (BID) | INTRAMUSCULAR | Status: DC
  Administered 2013-05-16 – 2013-05-17 (×2): 80 mg via INTRAVENOUS
  Filled 2013-05-16 (×4): qty 1.28

## 2013-05-16 MED ORDER — ENOXAPARIN SODIUM 80 MG/0.8ML ~~LOC~~ SOLN
70.0000 mg | Freq: Two times a day (BID) | SUBCUTANEOUS | Status: DC
  Administered 2013-05-17 – 2013-05-18 (×3): 70 mg via SUBCUTANEOUS
  Filled 2013-05-16 (×5): qty 0.8

## 2013-05-16 NOTE — Progress Notes (Signed)
ANTICOAGULATION CONSULT NOTE - Initial Consult  Pharmacy Consult for Lovenox Indication: DVT  No Known Allergies  Patient Measurements: Height: 5\' 7"  (170.2 cm) (per mother) Weight: 149 lb 11.1 oz (67.9 kg) IBW/kg (Calculated) : 61.6  Vital Signs: Temp: 97.7 F (36.5 C) (09/18 1400) Temp src: Core (Comment) (09/18 1200) BP: 142/87 mmHg (09/18 1400) Pulse Rate: 58 (09/18 1400)  Labs:  Recent Labs  05/14/13 0506 05/14/13 1700 05/15/13 0544 05/16/13 0500  HGB 11.0*  --  10.4* 10.4*  HCT 30.7*  --  30.7* 31.3*  PLT 199  --  205 216  CREATININE 0.54 0.61 0.52 0.50    Estimated Creatinine Clearance: 107.3 ml/min (by C-G formula based on Cr of 0.5).   Medical History: Past Medical History  Diagnosis Date  . Asthma     Medications:  Scheduled:  . albuterol  2.5 mg Nebulization Q3H  . antiseptic oral rinse  15 mL Mouth Rinse QID  . azithromycin  500 mg Intravenous Daily  . chlorhexidine  15 mL Mouth Rinse BID  . methylPREDNISolone (SOLU-MEDROL) injection  80 mg Intravenous Q12H  . pantoprazole sodium  40 mg Per Tube Q1200   Infusions:  . dextrose 5 % and 0.45% NaCl 50 mL/hr at 05/16/13 1211    Assessment: 22 yoF admitted 9/15 with severe asthma exacerbation 9/14 and impending respiratory failure.  9/18  preliminary LUE duplex reveals acute vs subacute DVT.  Pharmacy is asked to dose Lovenox.  Pt has been on Heparin 5000 units SQ TID since admission on 05/13/13.  SCr 0.5, CrCl > 100 ml/min.  CBC:  Hgb 10.4 and Plt 205  Goal of Therapy:  Anti-Xa level 0.6-1.2 units/ml 4hrs after LMWH dose given Monitor platelets by anticoagulation protocol: Yes   Plan:   D/C Heparin SQ  Lovenox 70 mg SQ q12h.  Follow up renal function and CBC.  Lynann Beaver PharmD, BCPS Pager [phone] 05/16/2013 3:05 PM

## 2013-05-16 NOTE — Progress Notes (Signed)
PULMONARY  / CRITICAL CARE MEDICINE  Name: Catherine Morrow MRN: [id number] DOB: Apr 18, 1991    ADMISSION DATE:  05/12/2013 CONSULTATION DATE:  05/13/2013  REFERRING MD :  Dr. Nicanor Alcon PRIMARY SERVICE: PCCM  CHIEF COMPLAINT:  SOB  BRIEF PATIENT DESCRIPTION: 62 F with severe asthma exacerbation 9/14 and impending respiratory failure.  SIGNIFICANT EVENTS / STUDIES:  UDS 9/15: +Opiates, benzos, and THC LUE [place] 9/18>>>  LINES / TUBES: ETT 05/13/2013>>>9/18 Right IJ CVL 9/15>>>  CULTURES 9/15 sputum: NF 9/15: U strep: neg 9/15 U legionella>>>neg 9/16 blood >>  9/16 urine >> neg   ANTIBIOTICS azithro 9/15>>> (planned 9/19)  PHYSICAL EXAM  VITAL SIGNS: Temp:  [99 F (37.2 C)-100.9 F (38.3 C)] 100.6 F (38.1 C) (09/18 0800) Pulse Rate:  [60-103] 102 (09/18 0800) Resp:  [12-16] 15 (09/18 0800) BP: (116-129)/(53-77) 129/69 mmHg (09/18 0800) SpO2:  [94 %-100 %] 98 % (09/18 0800) Arterial Line BP: (123-130)/(58-67) 123/67 mmHg (09/17 1000) FiO2 (%):  [30 %] 30 % (09/18 0800) Weight:  [64.4 kg (141 lb 15.6 oz)-67.9 kg (149 lb 11.1 oz)] 67.9 kg (149 lb 11.1 oz) (09/18 0500)  HEMODYNAMICS: CVP:  [7 mmHg-10 mmHg] 7 mmHg  VENTILATOR SETTINGS: Vent Mode:  [-] PSV;CPAP FiO2 (%):  [30 %] 30 % Set Rate:  [14 bmp] 14 bmp Vt Set:  [500 mL] 500 mL PEEP:  [5 cmH20] 5 cmH20 Pressure Support:  [5 cmH20] 5 cmH20 Plateau Pressure:  [16 cmH20-28 cmH20] 16 cmH20  INTAKE / OUTPUT: Intake/Output     09/17 0701 - 09/18 0700 09/18 0701 - 09/19 0700   I.V. (mL/kg) 2456.4 (36.2) 78.4 (1.2)   NG/GT 564 100   IV Piggyback 354    Total Intake(mL/kg) 3374.4 (49.7) 178.4 (2.6)   Urine (mL/kg/hr) 1010 (0.6) 100 (0.8)   Emesis/NG output 150 (0.1)    Total Output 1160 100   Net +2214.4 +78.4          PHYSICAL EXAMINATION: General:  Looks better. Follows command strength improved  Neuro:  F/c, no focal def  HEENT:  Sclera anicteric, Conjunctiva Pink, orally intubated  Cardiovascular: Tachy, Reg  Rhythem, NS1/S2, (-) MRG Lungs: exp wheeze  Abdomen:  S/NT/ND/(+)BS Musculoskeletal:  (-) C/C/E Skin:  (-) Rash  LABS:  CBC Recent Labs     05/14/13  0506  05/15/13  0544  05/16/13  0500  WBC  15.6*  16.1*  12.3*  HGB  11.0*  10.4*  10.4*  HCT  30.7*  30.7*  31.3*  PLT  199  205  216    Coag's No results found for this basename: APTT, INR,  in the last 72 hours  BMET Recent Labs     05/14/13  1700  05/15/13  0544  05/16/13  0500  NA  139  141  139  K  3.7  3.8  3.3*  CL  103  107  102  CO2  30  29  29   BUN  10  11  16   CREATININE  0.61  0.52  0.50  GLUCOSE  140*  123*  152*    Electrolytes Recent Labs     05/14/13  1700  05/15/13  0544  05/16/13  0500  CALCIUM  8.3*  8.1*  8.5    Sepsis Markers Recent Labs     05/15/13  0544  05/16/13  0500  PROCALCITON  0.74  0.29    ABG Recent Labs     05/14/13  0414  05/14/13  1405  05/15/13  0438  PHART  7.388  7.400  7.395  PCO2ART  46.6*  50.9*  44.3  PO2ART  118.0*  119.0*  123.0*    Liver Enzymes Recent Labs     05/14/13  0506  05/16/13  0500  AST  18  114*  ALT  10  57*  ALKPHOS  42  39  BILITOT  0.4  0.2*  ALBUMIN  3.0*  2.7*    Cardiac Enzymes No results found for this basename: TROPONINI, PROBNP,  in the last 72 hours  Glucose Recent Labs     05/13/13  1250  05/15/13  1603  05/15/13  1947  05/15/13  2324  05/16/13  0352  05/16/13  0730  GLUCAP  150*  124*  127*  121*  140*  138*    Imaging Dg Chest Port 1 View  05/16/2013   CLINICAL DATA:  Evaluate endotracheal tube position, airspace disease  EXAM: PORTABLE CHEST - 1 VIEW  COMPARISON:  Portable chest x-ray of 05/15/2013  FINDINGS: The tip of the endotracheal tube is approximately 2.9 cm above the carina. There is no change in aeration. The right IJ central venous line tip overlies the expected SVC- RA junction. Heart size is stable.  IMPRESSION: Tip of endotracheal tube 2.9 cm above the carina. No change in aeration.    Electronically Signed   By: Dwyane Dee M.D.   On: 05/16/2013 08:05   Dg Chest Port 1 View  05/15/2013   CLINICAL DATA:  Endotracheal tube placement  EXAM: PORTABLE CHEST - 1 VIEW  COMPARISON:  05/14/2013  FINDINGS: Endotracheal tube in good position. Right jugular catheter tip at the cavoatrial junction. No pneumothorax.  Minimal left lower lobe airspace disease has developed since the prior study. Right lung is clear. No effusion.  IMPRESSION: Support lines in good position.  Minimal left lower lobe airspace disease   Electronically Signed   By: Marlan Palau M.D.   On: 05/15/2013 07:32    EKG: None CXR: ETT in good position, lungs w/out infiltrate   ASSESSMENT / PLAN: Principal Problem:   Respiratory failure Active Problems:   Asthma exacerbation   PULMONARY A: Acute Hypoxic and hypercapneic Respiratory Failure 2/2 Asthma Exacerbation/ status asthmaticus  Unclear, 2 d of progressive dyspnea, but also mom notes about 3 weeks ago suddenly needed SABA (since school started back). She was THC positive  >9/16 afternoon, vent mechanics and gas exchange improving  >passed SBT  P:   Extubate 9/18 D/c sedation Taper steroids  See ID section Cont neb q3 SABA On Discharge needs smoking cessation counseling and PCP   CARDIOVASCULAR A:  Hypotension.  Resolved w/ IVFs, exacerbated by sedating gtts.  RUE edema--> need to r/o DVT in setting of fever  P: Cont IVFs-->keep even vol status  Cont tele  RUE US   RENAL A:  Hypokalemia P:  Continue to monitor with daily labs and replace per protocol.    GASTROINTESTINAL A: No Acute Issues: P: PPI and [place] heparin  Will start nutrition   HEMATOLOGIC A:  No Acute Issues  INFECTIOUS A: Low grade fevers P:  Empiric azithro for asthmatic exacerbation, to end 9/19  ENDOCRINE A: Steroid induced hyperglycemia  P: ssi protocol   NEUROLOGIC A: Sedation and paralysis, NMB gtt discontinued 9/16. P: d/c sedation and prep for  extubation 9/18   I have personally obtained a history, examined the patient, evaluated laboratory and imaging results, formulated the assessment and plan and placed orders.  CRITICAL CARE: The patient is critically ill with multiple organ systems failure and requires high complexity decision making for assessment and support, frequent evaluation and titration of therapies, application of advanced monitoring technologies and extensive interpretation of multiple databases. Critical Care Time devoted to patient care services described in this note is 40 minutes.    Levy Pupa, MD, PhD 05/16/2013, 10:56 AM [place] [facility] [phone] or if no answer [phone]

## 2013-05-16 NOTE — Progress Notes (Signed)
Utilization review completed.  

## 2013-05-16 NOTE — Progress Notes (Signed)
240 ml of Fentanyl (250 ml bag 10 mcg/ml) wasted by this RN. Witnessed by Langley Gauss, RN.

## 2013-05-16 NOTE — Progress Notes (Signed)
[facility] ADULT ICU REPLACEMENT PROTOCOL FOR AM LAB REPLACEMENT ONLY  The patient does not apply for the [facility] Adult ICU Electrolyte Replacment Protocol based on the criteria listed below:   1. Is GFR >/= 40 ml/min? yes  Patient's GFR today is >90 2. Is urine output >/= 0.5 ml/kg/hr for the last 6 hours? no Patient's UOP is 0.34 ml/kg/hr 3. Is BUN < 60 mg/dL? yes  Patient's BUN today is 16 4. Abnormal electrolyte(s):K+ 3.3 5. Ordered repletion with: none 6. If a panic level lab has been reported, has the CCM MD in charge been notified? yes.   Physician:  Dr Jackqulyn Livings, Renae Fickle [facility] 05/16/2013 6:04 AM

## 2013-05-16 NOTE — Progress Notes (Signed)
eLink Physician Progress Note and Electrolyte Replacement  Patient Name: Catherine Morrow DOB: June 22, 1991 MRN: [id number]  Date of Service  05/16/2013   HPI/Events of Note    Recent Labs Lab 05/13/13 0045 05/13/13 0655 05/14/13 0506 05/14/13 1700 05/15/13 0544 05/16/13 0500  NA 138 139 139 139 141 139  K 3.6 3.3* 3.2* 3.7 3.8 3.3*  CL 104 100 102 103 107 102  CO2  --  22 29 30 29 29   GLUCOSE 160* 167* 124* 140* 123* 152*  BUN 5* 6 7 10 11 16   CREATININE 0.70 0.60 0.54 0.61 0.52 0.50  CALCIUM  --  8.2* 8.2* 8.3* 8.1* 8.5  MG  --  1.9  --   --   --   --   PHOS  --  4.5  --   --   --   --     Estimated Creatinine Clearance: 107.3 ml/min (by C-G formula based on Cr of 0.5).  Intake/Output     09/17 0701 - 09/18 0700   I.V. (mL/kg) 2371.1 (36.8)   NG/GT 514   IV Piggyback 354   Total Intake(mL/kg) 3239.1 (50.3)   Urine (mL/kg/hr) 1010 (0.7)   Emesis/NG output 150 (0.1)   Total Output 1160   Net +2079.1        - I/O DETAILED x 24h    Total I/O In: 1462.2 [I.V.:1009.5; NG/GT:348.7; IV Piggyback:104] Out: 285 [Urine:285] - I/O THIS SHIFT    ASSESSMENT Low K  eICURN Interventions  kcl via tube   ASSESSMENT: MAJOR ELECTROLYTE      Dr. Kalman Shan, M.D., [facility].C.P Pulmonary and Critical Care Medicine Staff Physician [facility] [place] [facility] Pager: [phone], If no answer or between  15:00h - 7:00h: call [phone]  05/16/2013 6:36 AM

## 2013-05-16 NOTE — Procedures (Addendum)
Extubation Procedure Note  Patient Details:   Name: Catherine Morrow DOB: 01/12/1991 MRN: [id number]   Airway Documentation:  Airway 7 mm (Active)  Secured at (cm) 23 cm 05/16/2013  7:41 AM  Measured From Teeth 05/16/2013  7:41 AM  Secured Location Center 05/16/2013  7:41 AM  Secured By [employer] 05/16/2013  7:41 AM  Tube Holder Repositioned Yes 05/16/2013  7:41 AM  Cuff Pressure (cm H2O) 24 cm H2O 05/16/2013  7:41 AM  Site Condition Dry 05/16/2013  7:41 AM    Evaluation  O2 sats: stable throughout Complications: No apparent complications Patient did tolerate procedure well. Bilateral Breath Sounds: Expiratory wheezes (coarse/moist) Suctioning: Oral;Airway Yes. Pt. able to lift head off bed on own, (+) cuff leak noted, NIF -20, VC-1295, placed pt. on 2lpm n/c, no Stridor noted, RT to monitor. [name] 05/16/2013, 9:13 AM

## 2013-05-16 NOTE — Progress Notes (Signed)
*  PRELIMINARY RESULTS* Vascular Ultrasound Left upper extremity venous duplex has been completed.  Preliminary findings: Evidence of acute vs subacute DVT of the distal brachial vein. Evidence of acute vs subacute superficial thrombosis of the cephalic vein at mid forearm.    Farrel Demark, RDMS, RVT  05/16/2013, 11:34 AM

## 2013-05-16 NOTE — Progress Notes (Signed)
Pt. having Ultrasound @ this time, plan to give Aerosol tx. Asap, in no distress, RN aware, RT to monitor.

## 2013-05-17 ENCOUNTER — Inpatient Hospital Stay (HOSPITAL_COMMUNITY): Payer: BC Managed Care – PPO

## 2013-05-17 DIAGNOSIS — M7989 Other specified soft tissue disorders: Secondary | ICD-10-CM

## 2013-05-17 LAB — GLUCOSE, CAPILLARY
Glucose-Capillary: 105 mg/dL — ABNORMAL HIGH (ref 70–99)
Glucose-Capillary: 101 mg/dL — ABNORMAL HIGH (ref 70–99)
Glucose-Capillary: 96 mg/dL (ref 70–99)
Glucose-Capillary: 98 mg/dL (ref 70–99)

## 2013-05-17 LAB — PREGNANCY, URINE: Preg Test, Ur: NEGATIVE

## 2013-05-17 LAB — URINE CULTURE

## 2013-05-17 MED ORDER — METHYLPREDNISOLONE SODIUM SUCC 125 MG IJ SOLR
60.0000 mg | Freq: Two times a day (BID) | INTRAMUSCULAR | Status: DC
  Administered 2013-05-17 – 2013-05-18 (×2): 60 mg via INTRAVENOUS
  Filled 2013-05-17 (×4): qty 0.96

## 2013-05-17 MED ORDER — METOCLOPRAMIDE HCL 5 MG/ML IJ SOLN
5.0000 mg | Freq: Four times a day (QID) | INTRAMUSCULAR | Status: AC
  Administered 2013-05-17 – 2013-05-18 (×4): 5 mg via INTRAVENOUS
  Filled 2013-05-17 (×4): qty 1

## 2013-05-17 MED ORDER — ALBUTEROL SULFATE (5 MG/ML) 0.5% IN NEBU
2.5000 mg | INHALATION_SOLUTION | RESPIRATORY_TRACT | Status: DC | PRN
  Filled 2013-05-17 (×2): qty 0.5

## 2013-05-17 MED ORDER — PANTOPRAZOLE SODIUM 40 MG IV SOLR
40.0000 mg | Freq: Two times a day (BID) | INTRAVENOUS | Status: DC
  Administered 2013-05-17 – 2013-05-18 (×3): 40 mg via INTRAVENOUS
  Filled 2013-05-17 (×4): qty 40

## 2013-05-17 MED ORDER — BUDESONIDE-FORMOTEROL FUMARATE 80-4.5 MCG/ACT IN AERO
2.0000 | INHALATION_SPRAY | Freq: Two times a day (BID) | RESPIRATORY_TRACT | Status: DC
  Administered 2013-05-17 – 2013-05-18 (×3): 2 via RESPIRATORY_TRACT
  Filled 2013-05-17: qty 6.9

## 2013-05-17 MED ORDER — LEVOFLOXACIN IN D5W 500 MG/100ML IV SOLN
500.0000 mg | Freq: Every day | INTRAVENOUS | Status: DC
  Administered 2013-05-17 – 2013-05-18 (×2): 500 mg via INTRAVENOUS
  Filled 2013-05-17 (×2): qty 100

## 2013-05-17 MED ORDER — POTASSIUM CHLORIDE 10 MEQ/100ML IV SOLN
10.0000 meq | INTRAVENOUS | Status: AC
  Administered 2013-05-17 (×5): 10 meq via INTRAVENOUS
  Filled 2013-05-17 (×6): qty 100

## 2013-05-17 NOTE — Progress Notes (Signed)
*  PRELIMINARY RESULTS* Vascular Ultrasound Lower extremity venous duplex has been completed.  Preliminary findings: negative for DVT.    Farrel Demark, RDMS, RVT  05/17/2013, 11:16 AM

## 2013-05-17 NOTE — Progress Notes (Addendum)
PULMONARY  / CRITICAL CARE MEDICINE  Name: Catherine Morrow MRN: [id number] DOB: May 23, 1991    ADMISSION DATE:  05/12/2013 CONSULTATION DATE:  05/13/2013  REFERRING MD :  Dr. Nicanor Alcon PRIMARY SERVICE: PCCM  CHIEF COMPLAINT:  SOB  BRIEF PATIENT DESCRIPTION: 49 F with severe asthma exacerbation 9/14 and impending respiratory failure.  SIGNIFICANT EVENTS / STUDIES:  UDS 9/15: +Opiates, benzos, and THC LUE [place] 9/18>>>+ LUE DVT  BLE [place] 9/19>>>  LINES / TUBES: ETT 05/13/2013>>>9/18 Right IJ CVL 9/15>>>9/18  CULTURES 9/15 sputum: NF 9/15: U strep: neg 9/15 U legionella>>>neg 9/16 blood >>  9/17 BCX2>>> 9/16 urine >> neg  9/17: UC enterococcus (sens to levaquin)  ANTIBIOTICS azithro 9/15>>> 9/19 levaquin 9/19>>>  PHYSICAL EXAM  VITAL SIGNS: Temp:  [97.7 F (36.5 C)-98.4 F (36.9 C)] 98.4 F (36.9 C) (09/19 1000) Pulse Rate:  [51-74] 68 (09/19 1000) Resp:  [10-18] 16 (09/19 1000) BP: (122-155)/(81-102) 135/88 mmHg (09/19 1000) SpO2:  [96 %-100 %] 96 % (09/19 1000) Weight:  [64.1 kg (141 lb 5 oz)] 64.1 kg (141 lb 5 oz) (09/19 0332) Room air  HEMODYNAMICS:    VENTILATOR SETTINGS:    INTAKE / OUTPUT: Intake/Output     09/18 0701 - 09/19 0700 09/19 0701 - 09/20 0700   I.V. (mL/kg) 1218.4 (19) 100 (1.6)   NG/GT 150    IV Piggyback 250 250   Total Intake(mL/kg) 1618.4 (25.2) 350 (5.5)   Urine (mL/kg/hr) 2220 (1.4) 1300 (4.9)   Emesis/NG output 300 (0.2)    Total Output 2520 1300   Net -901.6 -950          PHYSICAL EXAMINATION: General:  Looks better. Generalized weakness Neuro:  F/c, no focal def  HEENT:  Sclera anicteric, Conjunctiva Pink, phonation faint Cardiovascular: Tachy, Reg Rhythem, NS1/S2, (-) MRG Lungs: exp wheeze improved, some scattered rhonchi.  Abdomen:  S/NT/ND/(+)BS Musculoskeletal:  (-) C/C/E Skin:  (-) Rash  LABS:  CBC Recent Labs     05/15/13  0544  05/16/13  0500  WBC  16.1*  12.3*  HGB  10.4*  10.4*  HCT  30.7*  31.3*  PLT  205   216    Coag's No results found for this basename: APTT, INR,  in the last 72 hours  BMET Recent Labs     05/14/13  1700  05/15/13  0544  05/16/13  0500  NA  139  141  139  K  3.7  3.8  3.3*  CL  103  107  102  CO2  30  29  29   BUN  10  11  16   CREATININE  0.61  0.52  0.50  GLUCOSE  140*  123*  152*    Electrolytes Recent Labs     05/14/13  1700  05/15/13  0544  05/16/13  0500  CALCIUM  8.3*  8.1*  8.5    Sepsis Markers Recent Labs     05/15/13  0544  05/16/13  0500  PROCALCITON  0.74  0.29    ABG Recent Labs     05/14/13  1405  05/15/13  0438  PHART  7.400  7.395  PCO2ART  50.9*  44.3  PO2ART  119.0*  123.0*    Liver Enzymes Recent Labs     05/16/13  0500  AST  114*  ALT  57*  ALKPHOS  39  BILITOT  0.2*  ALBUMIN  2.7*    Cardiac Enzymes No results found for this basename: TROPONINI, PROBNP,  in  the last 72 hours  Glucose Recent Labs     05/16/13  1234  05/16/13  1557  05/16/13  2005  05/17/13  0023  05/17/13  0332  05/17/13  0824  GLUCAP  115*  121*  120*  125*  105*  101*    Imaging Dg Chest Port 1 View  05/16/2013   CLINICAL DATA:  Evaluate endotracheal tube position, airspace disease  EXAM: PORTABLE CHEST - 1 VIEW  COMPARISON:  Portable chest x-ray of 05/15/2013  FINDINGS: The tip of the endotracheal tube is approximately 2.9 cm above the carina. There is no change in aeration. The right IJ central venous line tip overlies the expected SVC- RA junction. Heart size is stable.  IMPRESSION: Tip of endotracheal tube 2.9 cm above the carina. No change in aeration.   Electronically Signed   By: Dwyane Dee M.D.   On: 05/16/2013 08:05   Dg Abd Portable 1v  05/17/2013   CLINICAL DATA:  Evaluate for possible ileus. Asthma.  On steroids.  EXAM: PORTABLE ABDOMEN - 1 VIEW  COMPARISON:  05/16/2013 chest x-ray. No comparison chest CT.  FINDINGS: Nasogastric tube tip in gastric fundus level with the side hole just beyond the gastroesophageal  junction.  Gas-filled nondilated colon.  Foley catheter in place.  The possibility of free intraperitoneal air cannot be assessed on a supine view  IMPRESSION: Gas-filled nondilated colon. No gas distended small bowel loops.   Electronically Signed   By: Bridgett Larsson   On: 05/17/2013 10:58    EKG: None CXR: no film 9/19  ASSESSMENT / PLAN: Principal Problem:   Respiratory failure Active Problems:   Asthma exacerbation   PULMONARY A: Acute Hypoxic and hypercapneic Respiratory Failure 2/2 Asthma Exacerbation/ status asthmaticus  Unclear, 2 d of progressive dyspnea, but also mom notes about 3 weeks ago suddenly needed SABA (since school started back). She was THC positive  >9/16 afternoon, vent mechanics and gas exchange improving  >passed SBT and extubated 9/18 She is non room air and continuing to improve  P:   Taper steroids  Start symbicort and de-escalate SABA intensity  On Discharge needs smoking cessation counseling    CARDIOVASCULAR A:  Hypotension.  Resolved w/ IVFs, exacerbated by sedating gtts.   P: Cont IVFs-->keep even vol status  Cont tele   RENAL A:  Hypokalemia P:  Continue to monitor with daily labs and replace per protocol.    GASTROINTESTINAL A:  Nausea/ vomiting.  It is difficult to tell if this is med related or perhaps gagging on the NGT. By exam she does not appear to have ileus.  P: 1 view abd r/o ileus. If neg will d/c NGT (this may be making things worse) Add 24 hrs scheduled reglan Consider slow addition of diet  Aggressively correct K  HEMATOLOGIC A:  LUE DVT Has some swelling in lowers P: LMWH, checking on her insurance re: xarelto, will need 3 mo.   LE [place] for completeness as would extend length of rx   INFECTIOUS A:  Low grade fevers -->in retrospect likely d/t both UTI AND Upper ext DVT Enterococcus UTI  P:  D/c foley levaquin X 7d.   ENDOCRINE A: Steroid induced hyperglycemia  P: ssi protocol   NEUROLOGIC A:  Sedation and paralysis, NMB gtt discontinued 9/16.-->resolved      Post-critical illness weakness  P:  OOB Mobilize Consider PT   NOte by Anders Simmonds NPP   STAFF NOTE: I, Dr Lavinia Sharps have personally reviewed  patient's available data, including medical history, events of note, physical examination and test results as part of my evaluation. I have discussed with resident/NP and other care providers such as pharmacist, RN and RRT.  In addition,  I personally evaluated patient and elicited key findings of status asthma now extubated and deconditioned. Having nausea - abdominal X ray ok. Dopple legs in progress for LE swelling  Rest per NP/medical resident whose note is outlined above and that I agree with   Dr. Kalman Shan, M.D., [facility].C.P Pulmonary and Critical Care Medicine Staff Physician [facility] [place] [facility] Pager: [phone], If no answer or between  15:00h - 7:00h: call [phone]  05/17/2013 11:07 AM

## 2013-05-17 NOTE — Progress Notes (Signed)
NUTRITION FOLLOW-UP  INTERVENTION: Diet advancement per team. RD to continue to follow nutrition care plan and assess need for further nutrition interventions.  NUTRITION DIAGNOSIS: Inadequate oral intake related to inability to eat as evidenced by NPO status. Ongoing.  Goal: Intake to meet >90% of estimated nutrition needs. Unmet.  Monitor:  weight trends, lab trends, I/O's, PO intake, need for oral nutrition supplements  ESSMENT: PMHx significant for asthma. Admitted with SOB x 2 days. Work-up reveals acute asthma exacerbation. CXR negative for PNA. Intubated on admission.  Pt received EN during mechanical ventilation. Extubated 9/18; TF discontinued with extubation. Remains NPO at this time. Pt now with nausea and vomiting. Per MD note, plan to: view abdomen to r/o ileus (if negative will d/c NGT), add scheduled reglan, consider slow diet advancement.  Pt has yet to have a BM this admission. Potassium is currently low at 3.3.  Height: Ht Readings from Last 1 Encounters:  05/13/13 5\' 7"  (1.702 m)    Weight: Wt Readings from Last 1 Encounters:  05/17/13 141 lb 5 oz (64.1 kg)  Admit wt 130 lb. Wt up likely 2/2 fluid balance of +4.6 liters since admit.  BMI:  Body mass index is 22.13 kg/(m^2). WNL  Estimated Nutritional Needs: Kcal:  Protein: 77 - 90 g Fluid: 1.4 - 1.6 liters  Skin: intact  Diet Order: NPO  EDUCATION NEEDS: -No education needs identified at this time   Intake/Output Summary (Last 24 hours) at 05/17/13 0759 Last data filed at 05/17/13 0600  Gross per 24 hour  Intake 1568.42 ml  Output   2520 ml  Net -951.58 ml    Last BM: PTA  Labs:   Recent Labs Lab 05/13/13 0045 05/13/13 0655  05/14/13 1700 05/15/13 0544 05/16/13 0500  NA 138 139  < > 139 141 139  K 3.6 3.3*  < > 3.7 3.8 3.3*  CL 104 100  < > 103 107 102  CO2  --  22  < > 30 29 29   BUN 5* 6  < > 10 11 16   CREATININE 0.70 0.60  < > 0.61 0.52 0.50  CALCIUM  --  8.2*  < > 8.3* 8.1*  8.5  MG  --  1.9  --   --   --   --   PHOS  --  4.5  --   --   --   --   GLUCOSE 160* 167*  < > 140* 123* 152*  < > = values in this interval not displayed.  CBG (last 3)   Recent Labs  05/16/13 2005 05/17/13 0023 05/17/13 0332  GLUCAP 120* 125* 105*    Scheduled Meds: . albuterol  2.5 mg Nebulization Q3H  . antiseptic oral rinse  15 mL Mouth Rinse q12n4p  . azithromycin  500 mg Intravenous Daily  . chlorhexidine  15 mL Mouth Rinse BID  . enoxaparin (LOVENOX) injection  70 mg Subcutaneous Q12H  . methylPREDNISolone (SOLU-MEDROL) injection  80 mg Intravenous Q12H  . pantoprazole sodium  40 mg Per Tube Q1200    Continuous Infusions: . dextrose 5 % and 0.45% NaCl 50 mL/hr at 05/16/13 1211   Jarold Motto MS, RD, LDN Pager: [phone] After-hours pager: [phone]

## 2013-05-17 NOTE — Discharge Summary (Signed)
Physician Discharge Summary     Patient ID: Catherine Morrow MRN: [id number] DOB/AGE: [birth year] 22 y.o.  Admit date: 05/12/2013 Discharge date: 05/20/2013  Discharge Diagnoses:  Status asthmaticus Acute hypoxic/ hypercapnic respiratory failure Asthma Acute bronchitis  UTI (enterococcus) Left upper extremity DVT Nausea/vomiting   Detailed Hospital Course:    22 yo F with asthma who presented to [facility] on [facility]/12/2013 with 2d h/o severe shortness of breath. On further questioning her SOB had been worse over the prior 3 weeks since school restarted.  She presented to the ER in acute distress. She did not respond to several rounds of nebulizers and was eventually placed on BiPAP. She indicated that she was becoming tired and reluctantly agreed to intubation. Ultimately required intubation.   Was admitted to the ICU. Was in severe bronchospasm with air trapping physiology when on vent which required: high sedation, neuro-muscular blockade, as well as frequent scheduled SABA inhaled, higher dose systemic steroids and close titration of her ventilator. She remained on full vent support and neuro-muscular blockade until 9/17, at which time we weaned nimbex infusion off. Although she had improved significantly she still had significant wheeze and bronchospasm. She spiked fever the pm ours of 9/17 and we sent pan cultures. Her respiratory cultures and blood cultures were negative but urine did show pan-sensitive enterococcus. She was supported on the vent, weaned and extubated on 9/18.   On exam she was noted to have left upper extremity edema and in setting of fever we decided to check Upper extremity ultrasound. This indeed turned out to be positive and we therefore started her on LMWH. Her status continued to improve. Her steroids and broncho-dilator regimen was de-escalated, and she was mobilized out of bed. She was moved out of the ICU on 9/20. Empiric azithro was stopped on 9/19, however this was replaced  with levaquin due to her enterococcus UTI. By 9/19 her bronchospasm had improved dramatically. She was placed on symbicort and rescue PRN SABA. On 9/18 and 9/19 her course was complicated by nausea and vomiting. Flat plat of abd was unremarkable. Her NGT was removed and she was placed on low dose scheduled reglan for 4 doses. Her nausea subsided. We had a frank discussion about the importance of smoking cessation and avoiding smoking recreation drugs of any type (she was THC positive)  She Her diet was slowly advanced. She was converted to PO prednisone with slow taper. When she could tolerate full diet she was transitioned to xarelto on 9/20. She was deemed ready for d/c on 9/22. And sent home with the following plan of care.       Discharge Plan by diagnoses   Asthma s/p acute exacerbation and resolved respiratory failure Plan: Home on symbicort  Prednisone taper to off SABA PRN F/u w/ Tammy Parrett on 9/29 at 415 pm F/u Sood OCt 20th 4pm  Smoking cessation  Left upper extremity DVT Plan: 3 months xarelto Have established her with [facility]  Enterococcus UTI Plan: Complete 7d total levaquin    Significant Hospital tests/ studies/ interventions and procedures  SIGNIFICANT EVENTS / STUDIES:  UDS 9/15: +Opiates, benzos, and THC  LUE [place] 9/18>>>+ LUE DVT  BLE [place] 9/19>>> negative for DVT   LINES / TUBES:  ETT 05/13/2013>>>9/18  Right IJ CVL 9/15>>>9/18   CULTURES  9/15 sputum: NF  9/15: U strep: neg  9/15 U legionella>>>neg  9/16 blood >>  9/17 BCX2>>>  9/16 urine >> neg  9/17: UC enterococcus (sens to levaquin)  ANTIBIOTICS  azithro 9/15>>> 9/19  levaquin 9/19>>>   Discharge Exam: BP 94/63  Pulse 71  Temp(Src) 98.3 F (36.8 C) (Oral)  Resp 18  Ht 5\' 6"  (1.676 m)  Wt 131 lb 9.6 oz (59.693 kg)  BMI 21.25 kg/m2  SpO2 100%  General: Looks better. Generalized weakness  Neuro: F/c, no focal def  HEENT: Sclera anicteric, Conjunctiva Pink, phonation faint   Cardiovascular: Tachy, Reg Rhythem, NS1/S2, (-) MRG  Lungs: exp wheeze improved, some scattered rhonchi.  Abdomen: S/NT/ND/(+)BS  Musculoskeletal: (-) C/C/E  Skin: (-) Rash   Labs at discharge Lab Results  Component Value Date   CREATININE 0.61 05/19/2013   BUN 12 05/19/2013   NA 132* 05/19/2013   K 2.8* 05/19/2013   CL 101 05/19/2013   CO2 22 05/19/2013   Lab Results  Component Value Date   WBC 9.3 05/19/2013   HGB 12.7 05/19/2013   HCT 35.9* 05/19/2013   MCV 82.0 05/19/2013   PLT 291 05/19/2013   Lab Results  Component Value Date   ALT 57* 05/16/2013   AST 114* 05/16/2013   ALKPHOS 39 05/16/2013   BILITOT 0.2* 05/16/2013   Lab Results  Component Value Date   INR 1.20 05/13/2013   Disposition:  Home. No home health needs identified.        Discharge Orders   Future Appointments Provider Department Dept Phone   05/27/2013 4:15 PM Julio Sicks, NP [facility] [phone]   06/17/2013 4:00 PM Coralyn Helling, MD [facility] [phone]   08/12/2013 9:30 AM Nicki Reaper, NP [facility] [phone]   Future Orders Complete By Expires   Call MD for:  difficulty breathing, headache or visual disturbances  As directed    Call MD for:  persistant dizziness or light-headedness  As directed    Call MD for:  temperature >100.4  As directed    Diet general  As directed    Discharge instructions  As directed    Comments:     1. Stop Smoking! 2. Review your medications carefully.   Increase activity slowly  As directed        Medication List         albuterol 108 (90 BASE) MCG/ACT inhaler  Commonly known as:  PROVENTIL HFA;VENTOLIN HFA  Inhale 2 puffs into the lungs every 6 (six) hours as needed for wheezing (SOB).     budesonide-formoterol 160-4.5 MCG/ACT inhaler  Commonly known as:  SYMBICORT  Inhale 2 puffs into the lungs 2 (two) times daily.     levofloxacin 750 MG tablet  Commonly known as:  LEVAQUIN  Take 1 tablet (750  mg total) by mouth daily.     predniSONE 10 MG tablet  Commonly known as:  DELTASONE  3 tabs by mouth for 3 days, then 2 tabs for 3 days then 1 tab for 3 days and stop     Rivaroxaban 15 MG Tabs tablet  Commonly known as:  XARELTO  Take 1 tablet (15 mg total) by mouth 2 (two) times daily with a meal.     Rivaroxaban 20 MG Tabs tablet  Commonly known as:  XARELTO  Take 1 tablet (20 mg total) by mouth daily.  Start taking on:  06/09/2013       Follow-up Information   Follow up with Nicki Reaper, NP On 08/12/2013. (930am )    Specialty:  Internal Medicine   Contact information:   [address] [place] [address]  [phone]       Follow up with PARRETT,TAMMY, NP On 05/27/2013. (415pm )    Specialty:  Nurse Practitioner   Contact information:   [address] [place] [address] [phone]       Follow up with Coralyn Helling, MD On 06/17/2013. (4pm)    Specialty:  Pulmonary Disease   Contact information:   [address] [place] [address] [phone]       Discharged Condition: good  Canary Brim, NP-C [facility] Pgr: [phone] or [phone]    Physician Statement:   The Patient was personally examined, the discharge assessment and plan has been personally reviewed and I agree with assessment and plan. > 35 minutes of time have been dedicated to discharge assessment, planning and discharge instructions.    Sandrea Hughs, MD Pulmonary and Critical Care Medicine  Healthcare Cell [phone] After 5:30 PM or weekends, call [phone]

## 2013-05-18 DIAGNOSIS — I82A19 Acute embolism and thrombosis of unspecified axillary vein: Secondary | ICD-10-CM

## 2013-05-18 DIAGNOSIS — I82A12 Acute embolism and thrombosis of left axillary vein: Secondary | ICD-10-CM | POA: Diagnosis not present

## 2013-05-18 DIAGNOSIS — Z720 Tobacco use: Secondary | ICD-10-CM | POA: Diagnosis present

## 2013-05-18 LAB — GLUCOSE, CAPILLARY
Glucose-Capillary: 95 mg/dL (ref 70–99)
Glucose-Capillary: 91 mg/dL (ref 70–99)

## 2013-05-18 MED ORDER — PANTOPRAZOLE SODIUM 40 MG PO TBEC
40.0000 mg | DELAYED_RELEASE_TABLET | Freq: Every day | ORAL | Status: DC
  Administered 2013-05-18 – 2013-05-20 (×3): 40 mg via ORAL
  Filled 2013-05-18 (×3): qty 1

## 2013-05-18 MED ORDER — PREDNISONE 20 MG PO TABS
40.0000 mg | ORAL_TABLET | Freq: Every day | ORAL | Status: DC
  Administered 2013-05-18 – 2013-05-20 (×3): 40 mg via ORAL
  Filled 2013-05-18 (×4): qty 2

## 2013-05-18 MED ORDER — LEVOFLOXACIN 750 MG PO TABS
750.0000 mg | ORAL_TABLET | Freq: Every day | ORAL | Status: DC
  Administered 2013-05-18 – 2013-05-20 (×3): 750 mg via ORAL
  Filled 2013-05-18 (×3): qty 1

## 2013-05-18 MED ORDER — RIVAROXABAN 15 MG PO TABS
15.0000 mg | ORAL_TABLET | Freq: Two times a day (BID) | ORAL | Status: DC
Start: 2013-05-18 — End: 2013-05-20
  Administered 2013-05-18 – 2013-05-20 (×4): 15 mg via ORAL
  Filled 2013-05-18 (×6): qty 1

## 2013-05-18 MED ORDER — BUDESONIDE-FORMOTEROL FUMARATE 160-4.5 MCG/ACT IN AERO
2.0000 | INHALATION_SPRAY | Freq: Two times a day (BID) | RESPIRATORY_TRACT | Status: DC
  Administered 2013-05-19 – 2013-05-20 (×3): 2 via RESPIRATORY_TRACT
  Filled 2013-05-18: qty 6

## 2013-05-18 NOTE — Progress Notes (Signed)
PULMONARY  / CRITICAL CARE MEDICINE  Name: Catherine Morrow MRN: [id number] DOB: [birth year]    ADMISSION DATE:  05/12/2013 CONSULTATION DATE:  05/13/2013  REFERRING MD :  Dr. Nicanor Alcon PRIMARY SERVICE: PCCM  CHIEF COMPLAINT:  SOB  BRIEF PATIENT DESCRIPTION: 22 F with severe asthma exacerbation 9/14 and impending respiratory failure.  SIGNIFICANT EVENTS / STUDIES:  UDS 9/15: +Opiates, benzos, and THC LUE [place] 9/18>>>+ LUE DVT  BLE [place] 9/19>>>  LINES / TUBES: ETT 05/13/2013>>>9/18 Right IJ CVL 9/15>>>9/18  CULTURES 9/15 sputum: NF 9/15: U strep: neg 9/15 U legionella>>>neg 9/16 blood >>  9/17 BCX2>>> 9/16 urine >> neg  9/17: UC enterococcus (sens to levaquin)  ANTIBIOTICS azithro 9/15>>> 9/19 levaquin 9/19>>>  PHYSICAL EXAM  SUBJECTIVE: Markedly better  VITAL SIGNS: Temp:  [98.2 F (36.8 C)-98.7 F (37.1 C)] 98.6 F (37 C) (09/20 0800) Pulse Rate:  [50-73] 53 (09/20 0400) Resp:  [12-25] 14 (09/20 0400) BP: (128-159)/(79-116) 132/79 mmHg (09/20 0400) SpO2:  [95 %-100 %] 98 % (09/20 0400) FiO2 (%):  [21 %] 21 % (09/19 1323) Weight:  [58.3 kg (128 lb 8.5 oz)] 58.3 kg (128 lb 8.5 oz) (09/20 0400) Room air      VENTILATOR SETTINGS: Vent Mode:  [-]  FiO2 (%):  [21 %] 21 %  INTAKE / OUTPUT: Intake/Output     09/19 0701 - 09/20 0700 09/20 0701 - 09/21 0700   P.O. 120    I.V. (mL/kg) 550 (9.4)    Other 150    NG/GT     IV Piggyback 750    Total Intake(mL/kg) 1570 (26.9)    Urine (mL/kg/hr) 4150 (3)    Emesis/NG output     Total Output 4150     Net -2580            PHYSICAL EXAMINATION: General:  Looks better. Generalized weakness Neuro:  F/c, no focal def  HEENT:  Sclera anicteric, Conjunctiva Pink, phonation faint Cardiovascular: Tachy, Reg Rhythem, NS1/S2, (-) MRG Lungs: clear Abdomen:  S/NT/ND/(+)BS Musculoskeletal:  (-) C/C/E Skin:  (-) Rash  LABS:  CBC Recent Labs     05/16/13  0500  WBC  12.3*  HGB  10.4*  HCT  31.3*  PLT  216     Coag's No results found for this basename: APTT, INR,  in the last 72 hours  BMET Recent Labs     05/16/13  0500  NA  139  K  3.3*  CL  102  CO2  29  BUN  16  CREATININE  0.50  GLUCOSE  152*    Electrolytes Recent Labs     05/16/13  0500  CALCIUM  8.5    Sepsis Markers Recent Labs     05/16/13  0500  PROCALCITON  0.29    ABG No results found for this basename: PHART, PCO2ART, PO2ART,  in the last 72 hours  Liver Enzymes Recent Labs     05/16/13  0500  AST  114*  ALT  57*  ALKPHOS  39  BILITOT  0.2*  ALBUMIN  2.7*    Cardiac Enzymes No results found for this basename: TROPONINI, PROBNP,  in the last 72 hours  Glucose Recent Labs     05/17/13  1306  05/17/13  1557  05/17/13  1935  05/17/13  2330  05/18/13  0334  05/18/13  0732  GLUCAP  96  98  90  100*  96  95    Imaging Dg Abd Portable 1v  05/17/2013  CLINICAL DATA:  Evaluate for possible ileus. Asthma.  On steroids.  EXAM: PORTABLE ABDOMEN - 1 VIEW  COMPARISON:  05/16/2013 chest x-ray. No comparison chest CT.  FINDINGS: Nasogastric tube tip in gastric fundus level with the side hole just beyond the gastroesophageal junction.  Gas-filled nondilated colon.  Foley catheter in place.  The possibility of free intraperitoneal air cannot be assessed on a supine view  IMPRESSION: Gas-filled nondilated colon. No gas distended small bowel loops.   Electronically Signed   By: Bridgett Larsson   On: 05/17/2013 10:58    EKG: None CXR: no film 9/20  ASSESSMENT / PLAN: Principal Problem:   Respiratory failure Active Problems:   Asthma exacerbation   DVT of axillary vein, acute left   PULMONARY A: Acute Hypoxic and hypercapneic Respiratory Failure 2/2 Asthma Exacerbation/ status asthmaticus  Both above RESOLVED.  P:   Taper steroids to po  Start symbicort 160 and de-escalate SABA intensity  On Discharge needs smoking cessation counseling    CARDIOVASCULAR A:  Hypotension.  ivf to kvo    P: ivf kvo   RENAL A:  Hypokalemia resolved P:  monuitor  GASTROINTESTINAL A:  Nausea/ vomiting. Resolved P: Adv diet PPI PO  HEMATOLOGIC A:  LUE DVT No DVT in LE P: Xarelto to start, Mom will pay for copay  INFECTIOUS A:  Low grade fevers -->in retrospect likely d/t both UTI AND Upper ext DVT Enterococcus UTI  P:  levaquin X 7d. Change to po  ENDOCRINE A: Steroid induced hyperglycemia  P: ssi protocol   NEUROLOGIC A: Sedation and paralysis, NMB gtt discontinued 9/16.-->resolved      Post-critical illness weakness  P:  OOB Mobilize  Transfer to floor. Prob d/c home 1-2 days Caryl Bis  [phone]  Cell  [phone]  If no response or cell goes to voicemail, call beeper [phone]   05/18/2013 9:30 AM

## 2013-05-18 NOTE — Progress Notes (Signed)
Catherine Morrow 1233 TRANSFERRED TO 1516- NO CHANGE IN CONDITION. REPORT GIVEN TO QUEEN RN.

## 2013-05-18 NOTE — Progress Notes (Signed)
Patient transferred from ICU. No change from initial am assessment. Will continue to monitor and follow the plan of care.

## 2013-05-19 LAB — CBC
MCHC: 35.4 g/dL (ref 30.0–36.0)
MCV: 82 fL (ref 78.0–100.0)
Platelets: 291 10*3/uL (ref 150–400)
RDW: 12.2 % (ref 11.5–15.5)
WBC: 9.3 10*3/uL (ref 4.0–10.5)

## 2013-05-19 LAB — BASIC METABOLIC PANEL
BUN: 12 mg/dL (ref 6–23)
Calcium: 8.9 mg/dL (ref 8.4–10.5)
Creatinine, Ser: 0.61 mg/dL (ref 0.50–1.10)
GFR calc Af Amer: >90 mL/min (ref >90)

## 2013-05-19 MED ORDER — ALBUTEROL SULFATE HFA 108 (90 BASE) MCG/ACT IN AERS
2.0000 | INHALATION_SPRAY | RESPIRATORY_TRACT | Status: DC | PRN
  Filled 2013-05-19 (×2): qty 6.7

## 2013-05-19 MED ORDER — POTASSIUM CHLORIDE CRYS ER 20 MEQ PO TBCR
40.0000 meq | EXTENDED_RELEASE_TABLET | Freq: Once | ORAL | Status: AC
  Administered 2013-05-19: 40 meq via ORAL
  Filled 2013-05-19: qty 2

## 2013-05-19 NOTE — Progress Notes (Addendum)
PULMONARY  / CRITICAL CARE MEDICINE  Name: Catherine Morrow MRN: [id number] DOB: Apr 04, 1991    ADMISSION DATE:  05/12/2013 CONSULTATION DATE:  05/13/2013  REFERRING MD :  Dr. Nicanor Alcon PRIMARY SERVICE: PCCM  CHIEF COMPLAINT:  SOB  BRIEF PATIENT DESCRIPTION: 26 F with severe asthma exacerbation 9/14 and impending respiratory failure required intubation.  SIGNIFICANT EVENTS / STUDIES:  UDS 9/15: +Opiates, benzos, and THC LUE [place] 9/18>>>+ LUE DVT  BLE [place] 9/19> neg  LINES / TUBES: ETT 05/13/2013>>>9/18 Right IJ CVL 9/15>>>9/18  CULTURES 9/15 sputum: nl flora 9/15: U strep: neg 9/15 U legionella>>>neg 9/16 blood >>  9/17 BCX2>>> 9/16 urine >> neg  9/17: UC enterococcus (sens to levaquin)  ANTIBIOTICS azithro 9/15>>> 9/19 levaquin 9/19> planned through 9/24   PHYSICAL EXAM  SUBJECTIVE: No sob at rest, L arm still swollen  VITAL SIGNS: Temp:  [98.4 F (36.9 C)-98.9 F (37.2 C)] 98.4 F (36.9 C) (09/21 0527) Pulse Rate:  [50-63] 57 (09/21 0527) Resp:  [18-20] 18 (09/21 0527) BP: (105-147)/(59-95) 105/59 mmHg (09/21 0527) SpO2:  [100 %] 100 % (09/21 0821) FIO2 Room air      VENTILATOR SETTINGS:    INTAKE / OUTPUT: Intake/Output     09/20 0701 - 09/21 0700 09/21 0701 - 09/22 0700   P.O. 320 480   I.V. (mL/kg) 150 (2.5)    Other     IV Piggyback 100    Total Intake(mL/kg) 570 (9.5) 480 (8)   Urine (mL/kg/hr)     Total Output       Net +570 +480        Urine Occurrence 5 x 1 x     PHYSICAL EXAMINATION: General:  nad at 30 deg hob Neuro:  F/c, no focal def  HEENT:  Sclera anicteric, Conjunctiva Pink, phonation faint Cardiovascular: Tachy, Reg Rhythem, NS1/S2, (-) MRG Lungs: clear Abdomen:  S/NT/ND/(+)BS Musculoskeletal:  Mild/mod L Hand swelling vs R Skin:  (-) Rash  LABS:  CBC Recent Labs     05/19/13  0540  WBC  9.3  HGB  12.7  HCT  35.9*  PLT  291    Coag's No results found for this basename: APTT, INR,  in the last 72 hours  BMET Recent  Labs     05/19/13  0540  NA  132*  K  2.8*  CL  101  CO2  22  BUN  12  CREATININE  0.61  GLUCOSE  82    Electrolytes Recent Labs     05/19/13  0540  CALCIUM  8.9    Sepsis Markers No results found for this basename: LACTICACIDVEN, PROCALCITON, O2SATVEN,  in the last 72 hours  ABG No results found for this basename: PHART, PCO2ART, PO2ART,  in the last 72 hours  Liver Enzymes No results found for this basename: AST, ALT, ALKPHOS, BILITOT, ALBUMIN,  in the last 72 hours  Cardiac Enzymes No results found for this basename: TROPONINI, PROBNP,  in the last 72 hours  Glucose Recent Labs     05/17/13  1557  05/17/13  1935  05/17/13  2330  05/18/13  0334  05/18/13  0732  05/18/13  1130  GLUCAP  98  90  100*  96  95  91    Imaging No results found.  EKG: None CXR: no film 9/20  ASSESSMENT / PLAN: Principal Problem:   Respiratory failure Active Problems:   Asthma exacerbation   DVT of axillary vein, acute left   Tobacco use  PULMONARY A: Acute Hypoxic and hypercapneic Respiratory Failure 2/2 Asthma Exacerbation/ status asthmaticus  Both above RESOLVED.  P:   continue steroids  po taper symbicort 160 2 bid  and de-escalate SABA intensity  On Discharge needs smoking cessation counseling    CARDIOVASCULAR A:  Hypotension.     P: ivf kvo   RENAL A:  Hypokalemia   P:  Supplement with kcl prn  GASTROINTESTINAL A:  Nausea/ vomiting. Resolved P: Reg diet  PPI   HEMATOLOGIC A:  LUE DVT No DVT in LE  P: Xarelto rx started 9/20  Mom will pay for copay  INFECTIOUS A:  Low grade fevers -->in retrospect likely d/t both UTI AND Upper ext DVT Enterococcus UTI  P:  levaquin x 5 days planned to be completed 9/24  ENDOCRINE A: Steroid induced hyperglycemia  P: ssi protocol   NEUROLOGIC A: Sedation and paralysis, NMB gtt discontinued 9/16.-       Post-critical illness weakness  P:  OOB Mobilize     Needs to  Be up and about  today, mininimize saba Discussed rule of 2s which she was clearly breaking chronically  prior to admit  Discharge am 9/22 planned  Sandrea Hughs, MD Pulmonary and Critical Care Medicine [facility] Cell [phone] After 5:30 PM or weekends, call [phone]

## 2013-05-20 MED ORDER — PREDNISONE 10 MG PO TABS: ORAL_TABLET | ORAL | Status: DC

## 2013-05-20 MED ORDER — BUDESONIDE-FORMOTEROL FUMARATE 160-4.5 MCG/ACT IN AERO: 2.0000 | INHALATION_SPRAY | Freq: Two times a day (BID) | RESPIRATORY_TRACT | Status: DC

## 2013-05-20 MED ORDER — ALBUTEROL SULFATE HFA 108 (90 BASE) MCG/ACT IN AERS: 2.0000 | INHALATION_SPRAY | Freq: Four times a day (QID) | RESPIRATORY_TRACT | Status: AC | PRN

## 2013-05-20 MED ORDER — RIVAROXABAN 20 MG PO TABS: 20.0000 mg | ORAL_TABLET | Freq: Every day | ORAL | Status: DC

## 2013-05-20 MED ORDER — RIVAROXABAN 15 MG PO TABS: 15.0000 mg | ORAL_TABLET | Freq: Two times a day (BID) | ORAL | Status: DC

## 2013-05-20 MED ORDER — LEVOFLOXACIN 750 MG PO TABS: 750.0000 mg | ORAL_TABLET | Freq: Every day | ORAL | Status: DC

## 2013-05-20 NOTE — Progress Notes (Signed)
PULMONARY  / CRITICAL CARE MEDICINE  Name: Catherine Morrow MRN: [id number] DOB: January 30, 1991    ADMISSION DATE:  05/12/2013 CONSULTATION DATE:  05/13/2013  REFERRING MD :  Dr. Nicanor Alcon PRIMARY SERVICE: PCCM  CHIEF COMPLAINT:  SOB  BRIEF PATIENT DESCRIPTION: 63 F with severe asthma exacerbation 9/14 and impending respiratory failure required intubation.  SIGNIFICANT EVENTS / STUDIES:  UDS 9/15: +Opiates, benzos, and THC LUE [place] 9/18>>>+ LUE DVT  BLE [place] 9/19> neg  LINES / TUBES: ETT 05/13/2013>>>9/18 Right IJ CVL 9/15>>>9/18  CULTURES 9/15 sputum: nl flora 9/15: U strep: neg 9/15 U legionella>>>neg 9/17 BCX2> ngtd (9/22) 9/17: UC enterococcus (sens to levaquin)  ANTIBIOTICS azithro 9/15>>> 9/19 levaquin 9/19> planned through 9/24   PHYSICAL EXAM  SUBJECTIVE: No sob at rest anticipating discharge today  VITAL SIGNS: Temp:  [97.6 F (36.4 C)-97.9 F (36.6 C)] 97.9 F (36.6 C) (09/22 0533) Pulse Rate:  [54-75] 54 (09/22 0533) Resp:  [16] 16 (09/22 0533) BP: (110-128)/(67-79) 118/67 mmHg (09/22 0533) SpO2:  [96 %-100 %] 96 % (09/22 0855) FIO2 Room air      VENTILATOR SETTINGS:    INTAKE / OUTPUT: Intake/Output     09/21 0701 - 09/22 0700 09/22 0701 - 09/23 0700   P.O. 1080 240   I.V. (mL/kg)     IV Piggyback     Total Intake(mL/kg) 1080 (18.1) 240 (4)   Net +1080 +240        Urine Occurrence 3 x 2 x   Stool Occurrence       PHYSICAL EXAMINATION: General:  nad at 30 deg hob Neuro:  F/c, no focal def  HEENT:  Sclera anicteric, Conjunctiva Pink, phonation faint Cardiovascular: Tachy, Reg Rhythem, NS1/S2, (-) MRG Lungs: clear Abdomen:  S/NT/ND/(+)BS Musculoskeletal:  Min L Hand swelling vs R Skin:  (-) Rash  LABS:  CBC Recent Labs     05/19/13  0540  WBC  9.3  HGB  12.7  HCT  35.9*  PLT  291    Coag's No results found for this basename: APTT, INR,  in the last 72 hours  BMET Recent Labs     05/19/13  0540  NA  132*  K  2.8*  CL  101  CO2   22  BUN  12  CREATININE  0.61  GLUCOSE  82    Electrolytes Recent Labs     05/19/13  0540  CALCIUM  8.9    Sepsis Markers No results found for this basename: LACTICACIDVEN, PROCALCITON, O2SATVEN,  in the last 72 hours  ABG No results found for this basename: PHART, PCO2ART, PO2ART,  in the last 72 hours  Liver Enzymes No results found for this basename: AST, ALT, ALKPHOS, BILITOT, ALBUMIN,  in the last 72 hours  Cardiac Enzymes No results found for this basename: TROPONINI, PROBNP,  in the last 72 hours  Glucose Recent Labs     05/17/13  1557  05/17/13  1935  05/17/13  2330  05/18/13  0334  05/18/13  0732  05/18/13  1130  GLUCAP  98  90  100*  96  95  91    Imaging No results found.  EKG: None CXR: no film 9/20  ASSESSMENT / PLAN: Principal Problem:   Respiratory failure Active Problems:   Asthma exacerbation   DVT of axillary vein, acute left   Tobacco use   PULMONARY A: Acute Hypoxic and hypercapneic Respiratory Failure 2/2 Asthma Exacerbation/ status asthmaticus  Both above RESOLVED.  P:  continue steroids  po taper symbicort 160 2 bid  and de-escalate SABA intensity  On Discharge needs smoking cessation counseling if resumes or having cravings but she says this is not the case   CARDIOVASCULAR A:  Hypotension,resolved         RENAL A:  Hypokalemia   P:  Supplement with kcl prn  GASTROINTESTINAL A:  Nausea/ vomiting. Resolved P: Reg diet  PPI   HEMATOLOGIC A:  LUE DVT No DVT in LE  P: Xarelto rx started 9/20  Mom will pay for copay  INFECTIOUS A:  Low grade fevers -->in retrospect likely d/t both UTI AND Upper ext DVT Enterococcus UTI  P:  levaquin x 5 days planned to be completed 9/24  ENDOCRINE A: Steroid induced hyperglycemia  P: ssi protocol   NEUROLOGIC A: Sedation and paralysis, NMB gtt discontinued 9/16.-       Post-critical illness weakness   - fully ambulatory on RA am 9/22 and ready for  discharge        The proper method of use, as well as anticipated side effects, of a metered-dose inhaler are discussed and demonstrated to the patient. Improved effectiveness after extensive coaching during this visit to a level of approximately  90%    Discharge am 9/22 planned- to see or Tammy NP in one week with all meds in hand  Sandrea Hughs, MD Pulmonary and Critical Care Medicine [facility] Cell [phone] After 5:30 PM or weekends, call [phone]

## 2013-05-20 NOTE — Progress Notes (Signed)
Pt discharged home.  Reviewed medication and discharge instructions with patient.  There were no further questions.

## 2013-05-21 LAB — CULTURE, BLOOD (ROUTINE X 2)

## 2013-05-27 ENCOUNTER — Encounter: Payer: Self-pay | Admitting: Adult Health

## 2013-05-27 ENCOUNTER — Ambulatory Visit (INDEPENDENT_AMBULATORY_CARE_PROVIDER_SITE_OTHER): Payer: BC Managed Care – PPO | Admitting: Adult Health

## 2013-05-27 VITALS — BP 116/66 | HR 96 | Temp 98.2°F | Ht 67.0 in | Wt 139.0 lb

## 2013-05-27 DIAGNOSIS — I82A19 Acute embolism and thrombosis of unspecified axillary vein: Secondary | ICD-10-CM

## 2013-05-27 DIAGNOSIS — I82A12 Acute embolism and thrombosis of left axillary vein: Secondary | ICD-10-CM

## 2013-05-27 DIAGNOSIS — J45901 Unspecified asthma with (acute) exacerbation: Secondary | ICD-10-CM

## 2013-05-27 DIAGNOSIS — J9600 Acute respiratory failure, unspecified whether with hypoxia or hypercapnia: Secondary | ICD-10-CM

## 2013-05-27 DIAGNOSIS — J9690 Respiratory failure, unspecified, unspecified whether with hypoxia or hypercapnia: Secondary | ICD-10-CM

## 2013-05-27 NOTE — Patient Instructions (Addendum)
Finish Prednisone as directed.  Continue on Symbicort 2 puffs Twice daily  , brush/rinse/gargle after use.  follow up Dr. Craige Cotta  Next month as planned  Please contact office for sooner follow up if symptoms do not improve or worsen or seek emergency care

## 2013-05-28 NOTE — Progress Notes (Signed)
Reviewed and agree with assessment/plan. 

## 2013-05-28 NOTE — Assessment & Plan Note (Signed)
Continue on Xarelto  Birth control discussed.  Keep arm elevated As needed   follow up in 4 weeks and As needed

## 2013-05-28 NOTE — Progress Notes (Signed)
  Subjective:    Patient ID: Catherine Morrow, female    DOB: 02/26/1991, 22 y.o.   MRN: [id number]  HPI 22 yo F with asthma who presented to [facility] on 05/12/2013 with status asthmaticus requiring intubation. Admitted by PCCM .  Former smoker 05/12/13  +[place] on UDS 05/13/13   05/27/13 Post Hospital follow up  Pt was admitted 05/12/13 for status asthmaticus  CXR neg for PNA .  Hospital course was compliacted by  severe bronchospasm with air trapping physiology when on vent which required: high sedation, neuro-muscular blockade, as well as frequent scheduled SABA inhaled, higher dose systemic steroids and close titration of her ventilator. She remained on full vent support and neuro-muscular blockade until 9/17  Extubated on 9/18.  Pt had left arm swelling with venous doppler +for DVT. Started on LMWH. Disharged on Xarelto for 3 month.  Course complicated by enterococcus UTI , discharged on complete course of Levaquin.  Discharged on prednisone taper.   Since discharge she is feeling much improved. Cough and wheezing have resolved. Has couple of days left of prednisone. No SABA use.  Has not smoked since discharge. Denies Marajuana use.  Cessation encouraged.  Doing well on xarelto with no known bleeding.  Mild residual left arm swelling. No pain or numbness.  No chest pain, n/v/d, urinary symptoms, hemoptysis.  Discussed birth control alternatives, no BCP .   Review of Systems     Objective:   Physical Exam        Assessment & Plan:

## 2013-05-28 NOTE — Assessment & Plan Note (Addendum)
Recent severe status asthmaticus requiring vent support.  Improved and doing well on symbicort  Pt education on asthma given Smoking and marajuana cessation encouraged.   Plan  Finish Prednisone as directed.  Continue on Symbicort 2 puffs Twice daily  , brush/rinse/gargle after use.  follow up Dr. Craige Cotta  Next month as planned  Please contact office for sooner follow up if symptoms do not improve or worsen or seek emergency care

## 2013-06-17 ENCOUNTER — Ambulatory Visit (INDEPENDENT_AMBULATORY_CARE_PROVIDER_SITE_OTHER): Payer: BC Managed Care – PPO | Admitting: Pulmonary Disease

## 2013-06-17 ENCOUNTER — Encounter: Payer: Self-pay | Admitting: Pulmonary Disease

## 2013-06-17 VITALS — BP 110/74 | HR 86 | Ht 66.0 in | Wt 142.0 lb

## 2013-06-17 DIAGNOSIS — J4540 Moderate persistent asthma, uncomplicated: Secondary | ICD-10-CM

## 2013-06-17 DIAGNOSIS — I82A12 Acute embolism and thrombosis of left axillary vein: Secondary | ICD-10-CM

## 2013-06-17 DIAGNOSIS — J309 Allergic rhinitis, unspecified: Secondary | ICD-10-CM

## 2013-06-17 DIAGNOSIS — I82A19 Acute embolism and thrombosis of unspecified axillary vein: Secondary | ICD-10-CM

## 2013-06-17 DIAGNOSIS — J45909 Unspecified asthma, uncomplicated: Secondary | ICD-10-CM

## 2013-06-17 MED ORDER — RIVAROXABAN 20 MG PO TABS: 20.0000 mg | ORAL_TABLET | Freq: Every day | ORAL | Status: DC

## 2013-06-17 MED ORDER — BECLOMETHASONE DIPROPIONATE 80 MCG/ACT IN AERS: 2.0000 | INHALATION_SPRAY | Freq: Two times a day (BID) | RESPIRATORY_TRACT | Status: DC

## 2013-06-17 NOTE — Assessment & Plan Note (Signed)
Not much of an issue present.  Defer further allergy testing/therapy at present.

## 2013-06-17 NOTE — Assessment & Plan Note (Signed)
She developed upper extremity DVT in setting of critical illness and central venous catheter placements.  She was started on xarelto May 18, 2013.  Plan is to continue xarelto until December 2014, and then re-assess.  Will also need to assess then whether she needs hypercoagulable evaluation.

## 2013-06-17 NOTE — Assessment & Plan Note (Signed)
She has improved since recent hospitalization.  Will step down her regimen.  Will have her use Qvar in place of symbicort.  She is to continue prn albuterol.  Will arrange for peak flow meter to monitor her asthma control.

## 2013-06-17 NOTE — Progress Notes (Signed)
Chief Complaint  Patient presents with  . Asthma    Breathing has improved. Denies SOB, chest tightness or coughing.    History of Present Illness: Catherine Morrow is a 22 y.o. female with asthma, allergic rhinitis and Lt arm DVT.  She has been doing well.  She is not having cough, wheeze, or sputum.  She is not having breathing difficulties with sleep.  She is not having arm pain/numbness.  She denies sinus congestion, post-nasal drip, or hoarseness.  Her allergies are worse in the Spring.  She is allergic to dogs, cats, grass, and dust.  She has allergy testing as a child, but was never on allergy shots.  She uses symbicort bid.  She has not needed albuterol recently.  She is no longer using prednisone.   TESTS: Upper extremity doppler 05/16/13 >> acute/subacute DVT Lt brachial vein, Lt cephalic vein 05/18/13 >> start xarelto  Spirometry 06/17/13 >> FEV1 2.57 (84%), FEV1% 76%  Catherine Morrow  has a past medical history of Asthma.  Catherine Morrow  has no past surgical history on file.  Prior to Admission medications   Medication Sig Start Date End Date Taking? Authorizing Provider  albuterol (PROVENTIL HFA;VENTOLIN HFA) 108 (90 BASE) MCG/ACT inhaler Inhale 2 puffs into the lungs every 6 (six) hours as needed for wheezing (SOB). 05/20/13  Yes Jeanella Craze, NP  budesonide-formoterol (SYMBICORT) 160-4.5 MCG/ACT inhaler Inhale 2 puffs into the lungs 2 (two) times daily. 05/20/13  Yes Jeanella Craze, NP  Rivaroxaban (XARELTO) 20 MG TABS tablet Take 1 tablet (20 mg total) by mouth daily. 06/09/13  Yes Jeanella Craze, NP    No Known Allergies   Physical Exam:  General - No distress, wears glasses ENT - No sinus tenderness, no oral exudate, no LAN, 2 + tonsils Cardiac - s1s2 regular, no murmur Chest - No wheeze/rales/dullness Back - No focal tenderness Abd - Soft, non-tender Ext - No edema Neuro - Normal strength Skin - No rashes Psych - normal mood, and  behavior   Assessment/Plan:  Coralyn Helling, MD [place] [facility]/Critical Care/Sleep Pager:  [phone]

## 2013-06-17 NOTE — Patient Instructions (Signed)
Spirometry today Qvar two puffs twice per day, and rinse mouth after each use Stop Symbicort Call if breathing gets worse after change to inhalers Will arrange for peak flow meter Follow up in 2 months

## 2013-08-12 ENCOUNTER — Ambulatory Visit: Payer: BC Managed Care – PPO | Admitting: Internal Medicine

## 2013-08-14 ENCOUNTER — Ambulatory Visit: Payer: BC Managed Care – PPO | Admitting: Pulmonary Disease

## 2013-09-04 ENCOUNTER — Ambulatory Visit: Payer: BC Managed Care – PPO | Admitting: Internal Medicine

## 2013-09-04 DIAGNOSIS — Z0289 Encounter for other administrative examinations: Secondary | ICD-10-CM

## 2013-12-09 ENCOUNTER — Encounter: Payer: Self-pay | Admitting: Pulmonary Disease

## 2013-12-09 ENCOUNTER — Ambulatory Visit (INDEPENDENT_AMBULATORY_CARE_PROVIDER_SITE_OTHER): Payer: BC Managed Care – PPO | Admitting: Pulmonary Disease

## 2013-12-09 VITALS — BP 120/80 | HR 68 | Temp 98.0°F | Ht 66.0 in | Wt 150.0 lb

## 2013-12-09 DIAGNOSIS — J45909 Unspecified asthma, uncomplicated: Secondary | ICD-10-CM

## 2013-12-09 DIAGNOSIS — I82A19 Acute embolism and thrombosis of unspecified axillary vein: Secondary | ICD-10-CM

## 2013-12-09 DIAGNOSIS — J4520 Mild intermittent asthma, uncomplicated: Secondary | ICD-10-CM

## 2013-12-09 DIAGNOSIS — J309 Allergic rhinitis, unspecified: Secondary | ICD-10-CM

## 2013-12-09 DIAGNOSIS — I82A12 Acute embolism and thrombosis of left axillary vein: Secondary | ICD-10-CM

## 2013-12-09 NOTE — Patient Instructions (Signed)
Follow up in one year >> call if help needed sooner

## 2013-12-09 NOTE — Assessment & Plan Note (Signed)
Resolved

## 2013-12-09 NOTE — Assessment & Plan Note (Signed)
She can continue prn inhaler therapy.  She will call when she needs refills.

## 2013-12-09 NOTE — Progress Notes (Signed)
Chief Complaint  Patient presents with  . Asthma    Breathing is unchanged. Denies SOB, chest tightness or coughing.    History of Present Illness: Catherine Morrow is a 23 y.o. female with asthma, allergic rhinitis and Lt arm DVT.  She has been off xarelto since January.  She denies pain/swelling in her arm.  She only uses symbicort prn.  She has not needed this for 2 weeks. She has not needed albuterol.  She denies sinus congestion, sore throat, chest pain, wheeze, cough, or sputum.  TESTS: Upper extremity doppler 05/16/13 >> acute/subacute DVT Lt brachial vein, Lt cephalic vein 05/18/13 >> start xarelto  Spirometry 06/17/13 >> FEV1 2.57 (84%), FEV1% 76%  Catherine Morrow  has a past medical history of Asthma.  Catherine Morrow  has no past surgical history on file.  Prior to Admission medications   Medication Sig Start Date End Date Taking? Authorizing Provider  albuterol (PROVENTIL HFA;VENTOLIN HFA) 108 (90 BASE) MCG/ACT inhaler Inhale 2 puffs into the lungs every 6 (six) hours as needed for wheezing (SOB). 05/20/13  Yes Jeanella CrazeBrandi L Ollis, NP  budesonide-formoterol (SYMBICORT) 160-4.5 MCG/ACT inhaler Inhale 2 puffs into the lungs 2 (two) times daily. 05/20/13  Yes Jeanella CrazeBrandi L Ollis, NP  Rivaroxaban (XARELTO) 20 MG TABS tablet Take 1 tablet (20 mg total) by mouth daily. 06/09/13  Yes Jeanella CrazeBrandi L Ollis, NP    No Known Allergies   Physical Exam:  General - No distress, wears glasses ENT - No sinus tenderness, no oral exudate, no LAN, 2 + tonsils, mild erythema of posterior pharynx Cardiac - s1s2 regular, no murmur Chest - No wheeze/rales/dullness Back - No focal tenderness Abd - Soft, non-tender Ext - No edema Neuro - Normal strength Skin - No rashes Psych - normal mood, and behavior   Assessment/Plan:  Catherine HellingVineet Rmani Kellogg, MD [place] [facility]/Critical Care/Sleep Pager:  [phone]

## 2013-12-09 NOTE — Assessment & Plan Note (Signed)
Not much of an issue at present. 

## 2014-09-02 IMAGING — CR DG CHEST 1V PORT
1 series · 1 of 1 positions shown · non-contrast
Comparison: Radiograph from 05/12/2013

CLINICAL DATA: Endotracheal tube placement

PORTABLE CHEST - 1 VIEW

[AP]
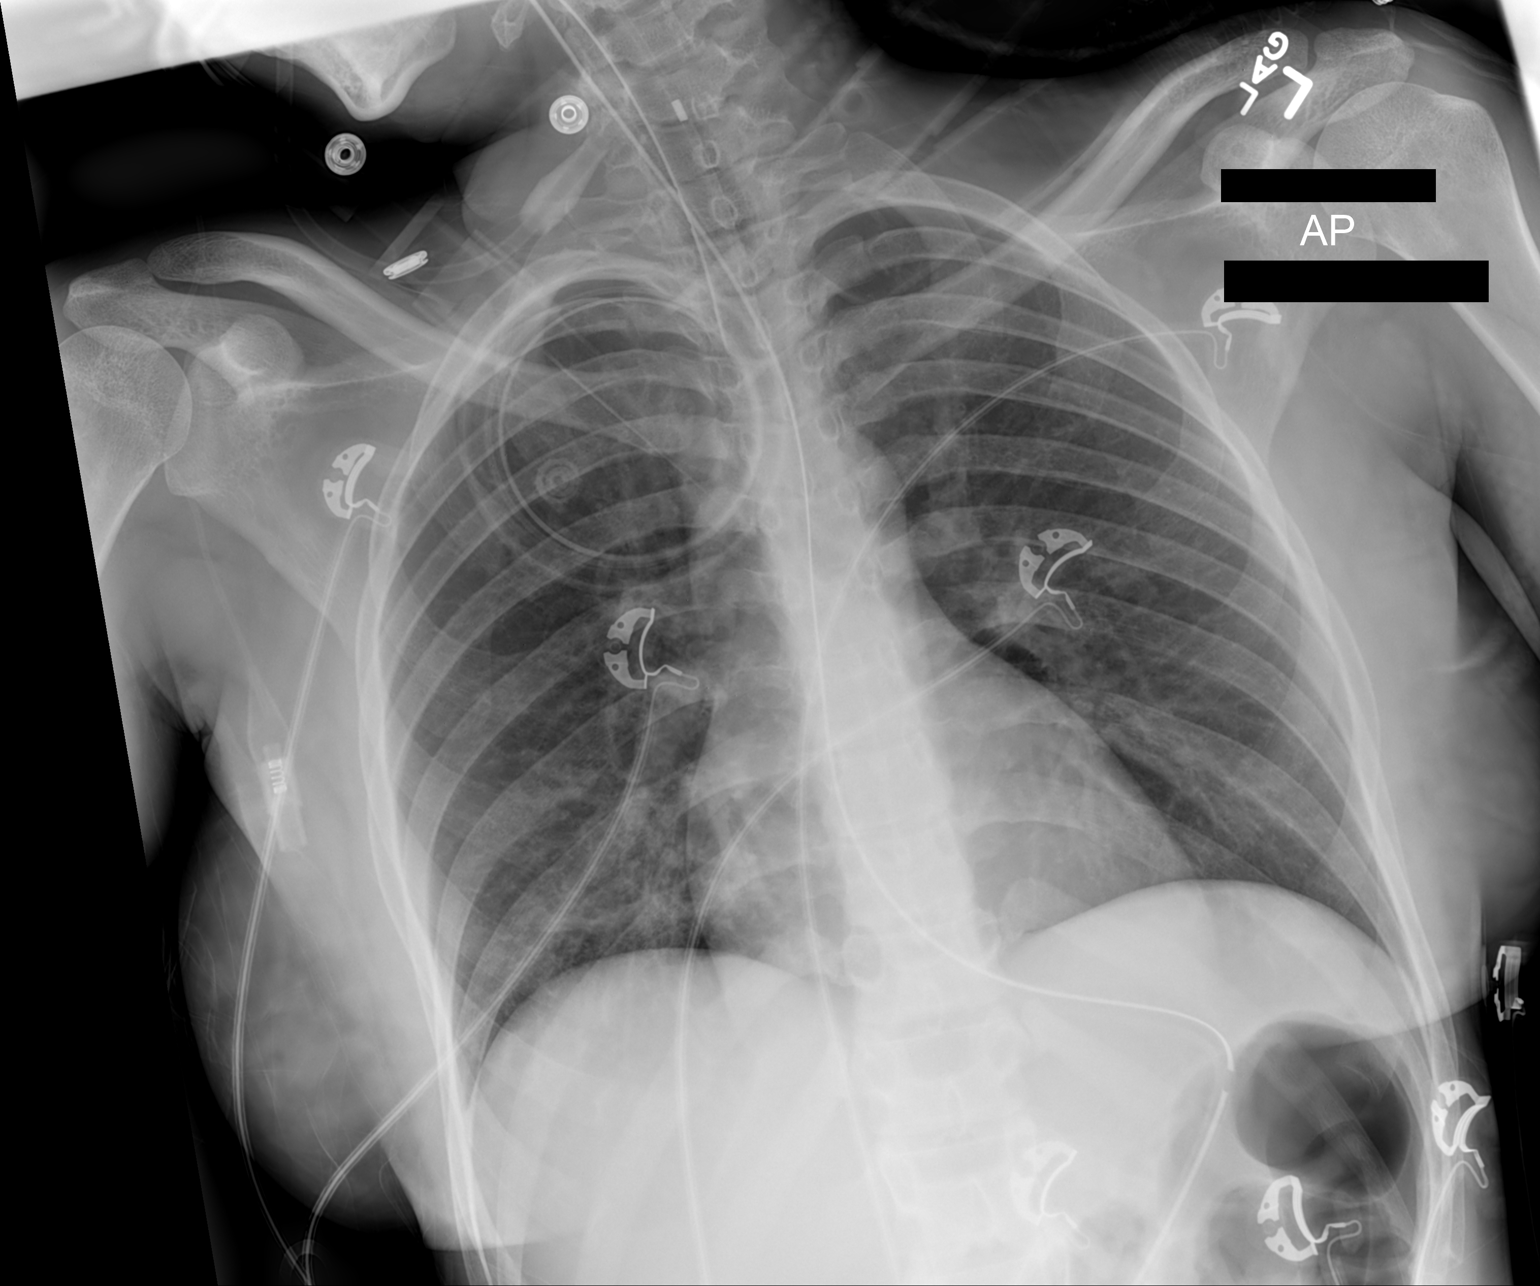

[1 of 1 positions shown; findings below may reference images not displayed]

FINDINGS: There is been interval placement of an endotracheal tube
with tip located 5.2 cm above the carina.  Side hole enteric tube
is well beyond the GE junction and overlies the stomach.  Cardiac
and mediastinal silhouettes are unchanged and remain within normal
limits.

The lungs remain clear without focal infiltrate, pulmonary edema,
or pleural effusion.  No pneumothorax.

No acute osseous abnormality.
IMPRESSION: 1.  Tip of the endotracheal tube 5.2 cm above the carina.
2.  No radiographic evidence of acute cardiopulmonary process.

## 2014-09-02 IMAGING — CR DG CHEST 1V PORT
1 series · 2 of 2 positions shown · non-contrast
Comparison: Prior radiograph performed earlier on the same day at
[DATE].

CLINICAL DATA: Endotracheal tube manipulation

PORTABLE CHEST - 1 VIEW

[Series 1: AP · U · 2 of 2 slices shown]
[im 1/2]
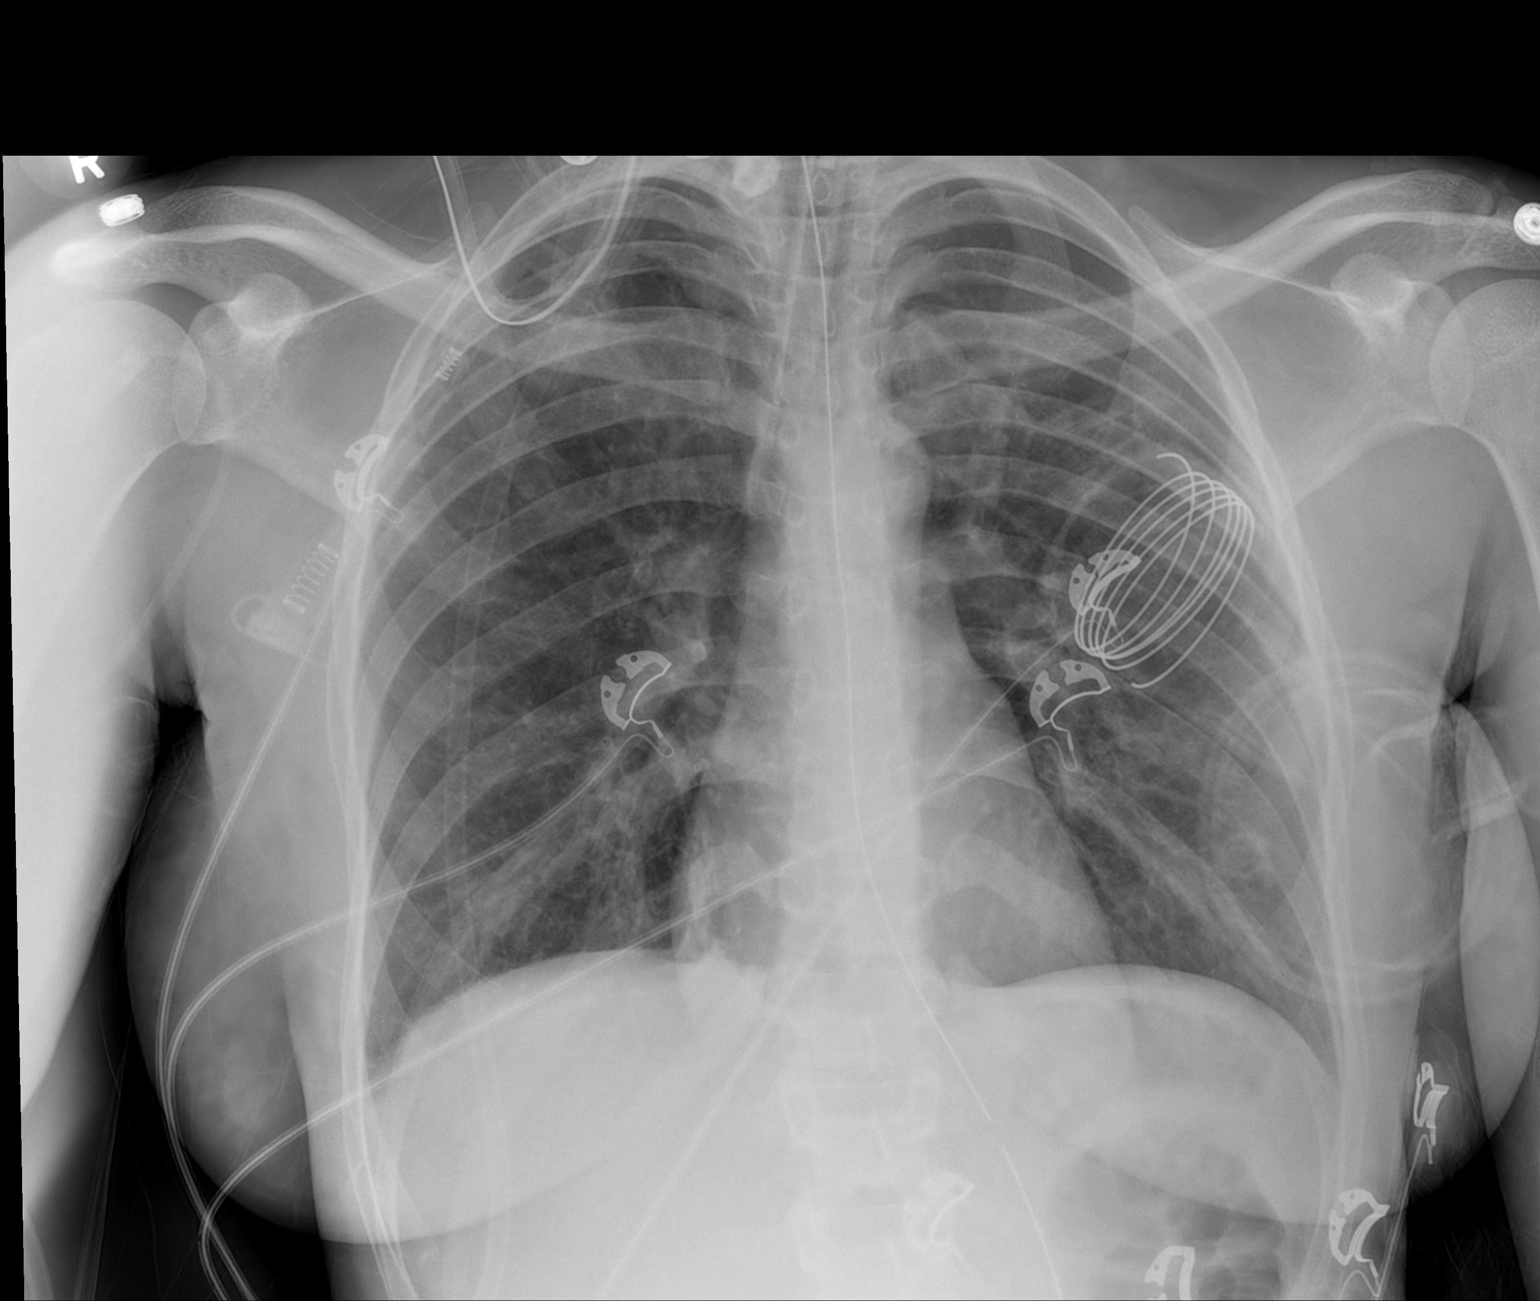
[im 2/2]
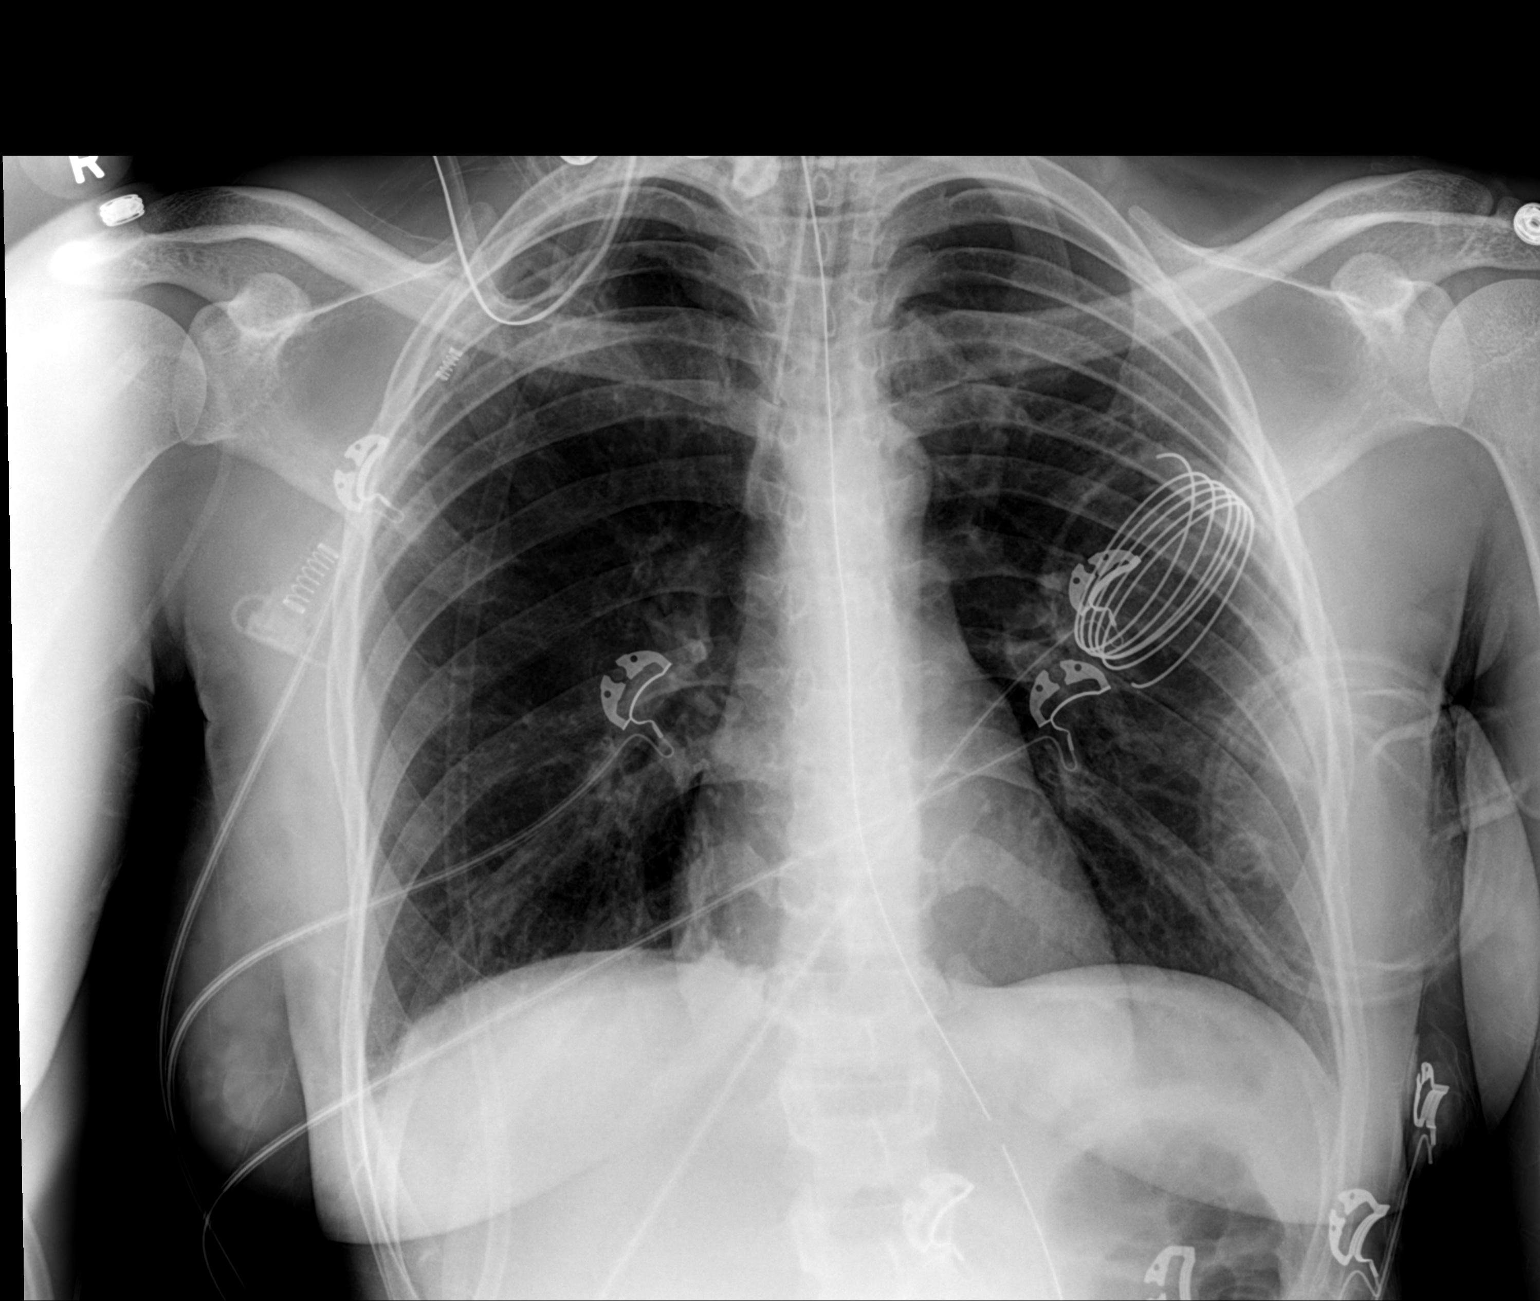

[2 of 2 positions shown; findings below may reference images not displayed]

FINDINGS: The tip of the endotracheal tube is located 4.4 cm above
the carina.  Enteric tube overlies the stomach.

There is been no significant interval change in the appearance of
the heart and lungs.  No pneumothorax.  No frank pulmonary edema or
airspace consolidation.
IMPRESSION: Tip of the endotracheal tube 4.4 cm above the carina.  Otherwise
stable appearance of her lungs.

## 2014-09-02 IMAGING — CR DG CHEST 1V PORT
1 series · 2 of 2 positions shown · non-contrast
Comparison: 05/13/2013 at [DATE] a.m.

CLINICAL DATA: Right-sided central line placement

PORTABLE CHEST - 1 VIEW

[Series 1: AP · U · 2 of 2 slices shown]
[im 1/2]
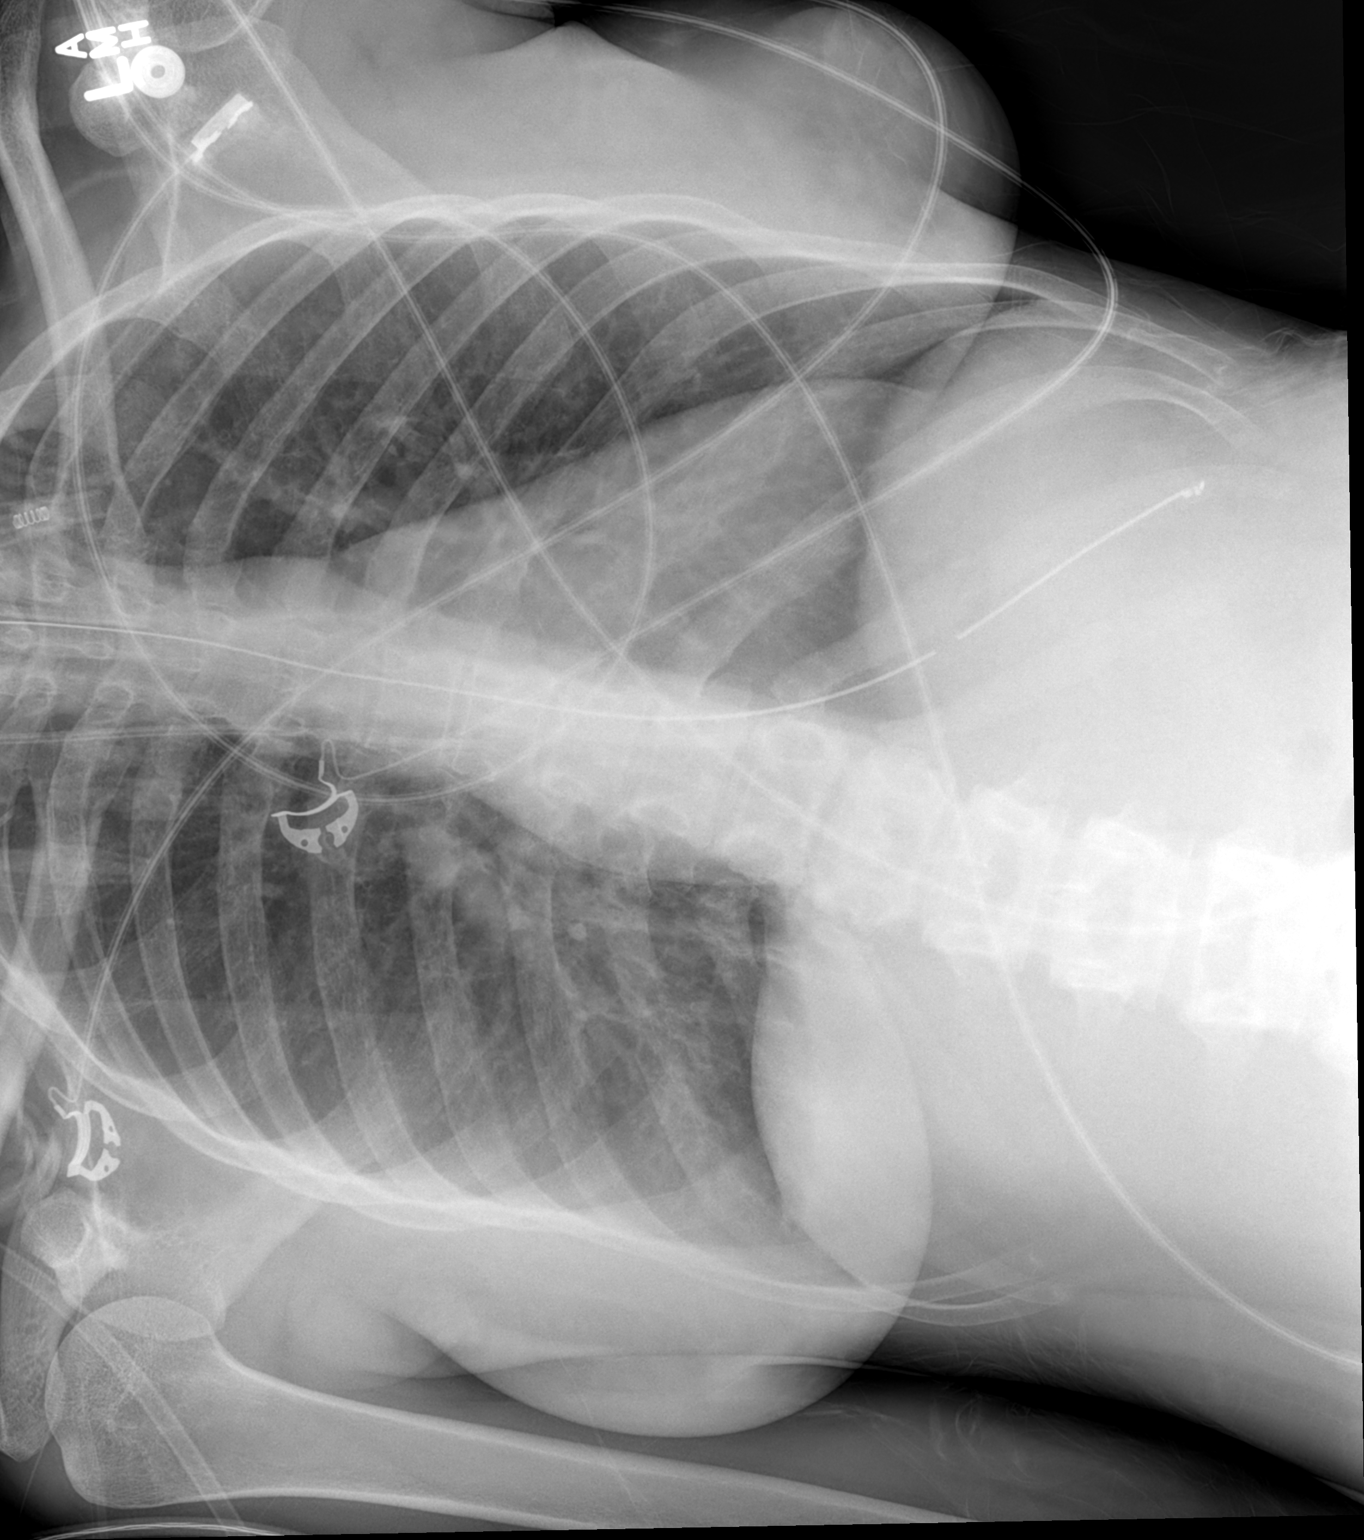
[im 2/2]
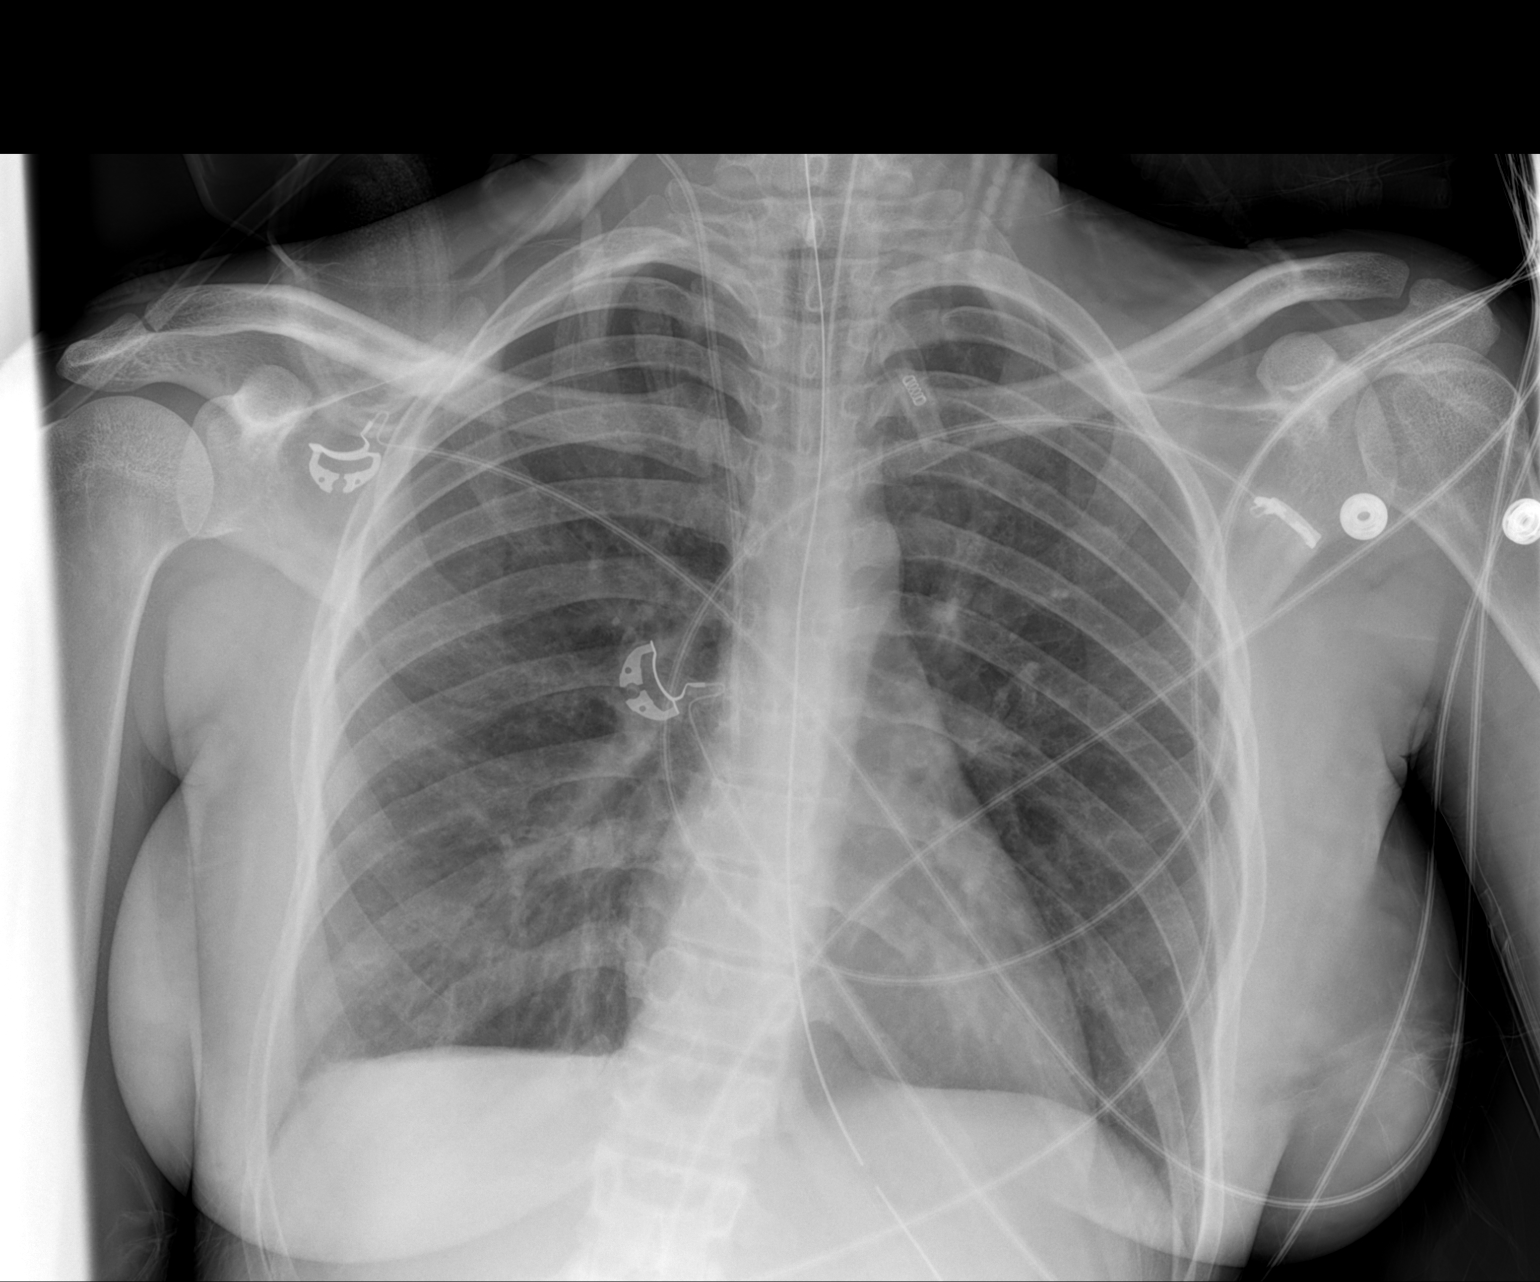

[2 of 2 positions shown; findings below may reference images not displayed]

FINDINGS: Right IJ central line tip terminates 4 cm below the
carina.  Heart size is normal. Endotracheal and nasogastric tubes
appropriately positioned. No pleural effusion.  No acute osseous
finding.  No pneumothorax.
IMPRESSION: Right IJ central line tip over distal SVC.

## 2014-09-03 IMAGING — CR DG CHEST 1V PORT
1 series · 1 of 1 positions shown · non-contrast
Comparison: Chest x-ray from yesterday

CLINICAL DATA: Respiratory failure. Endotracheal tube.

EXAM:
PORTABLE CHEST - 1 VIEW

[AP]
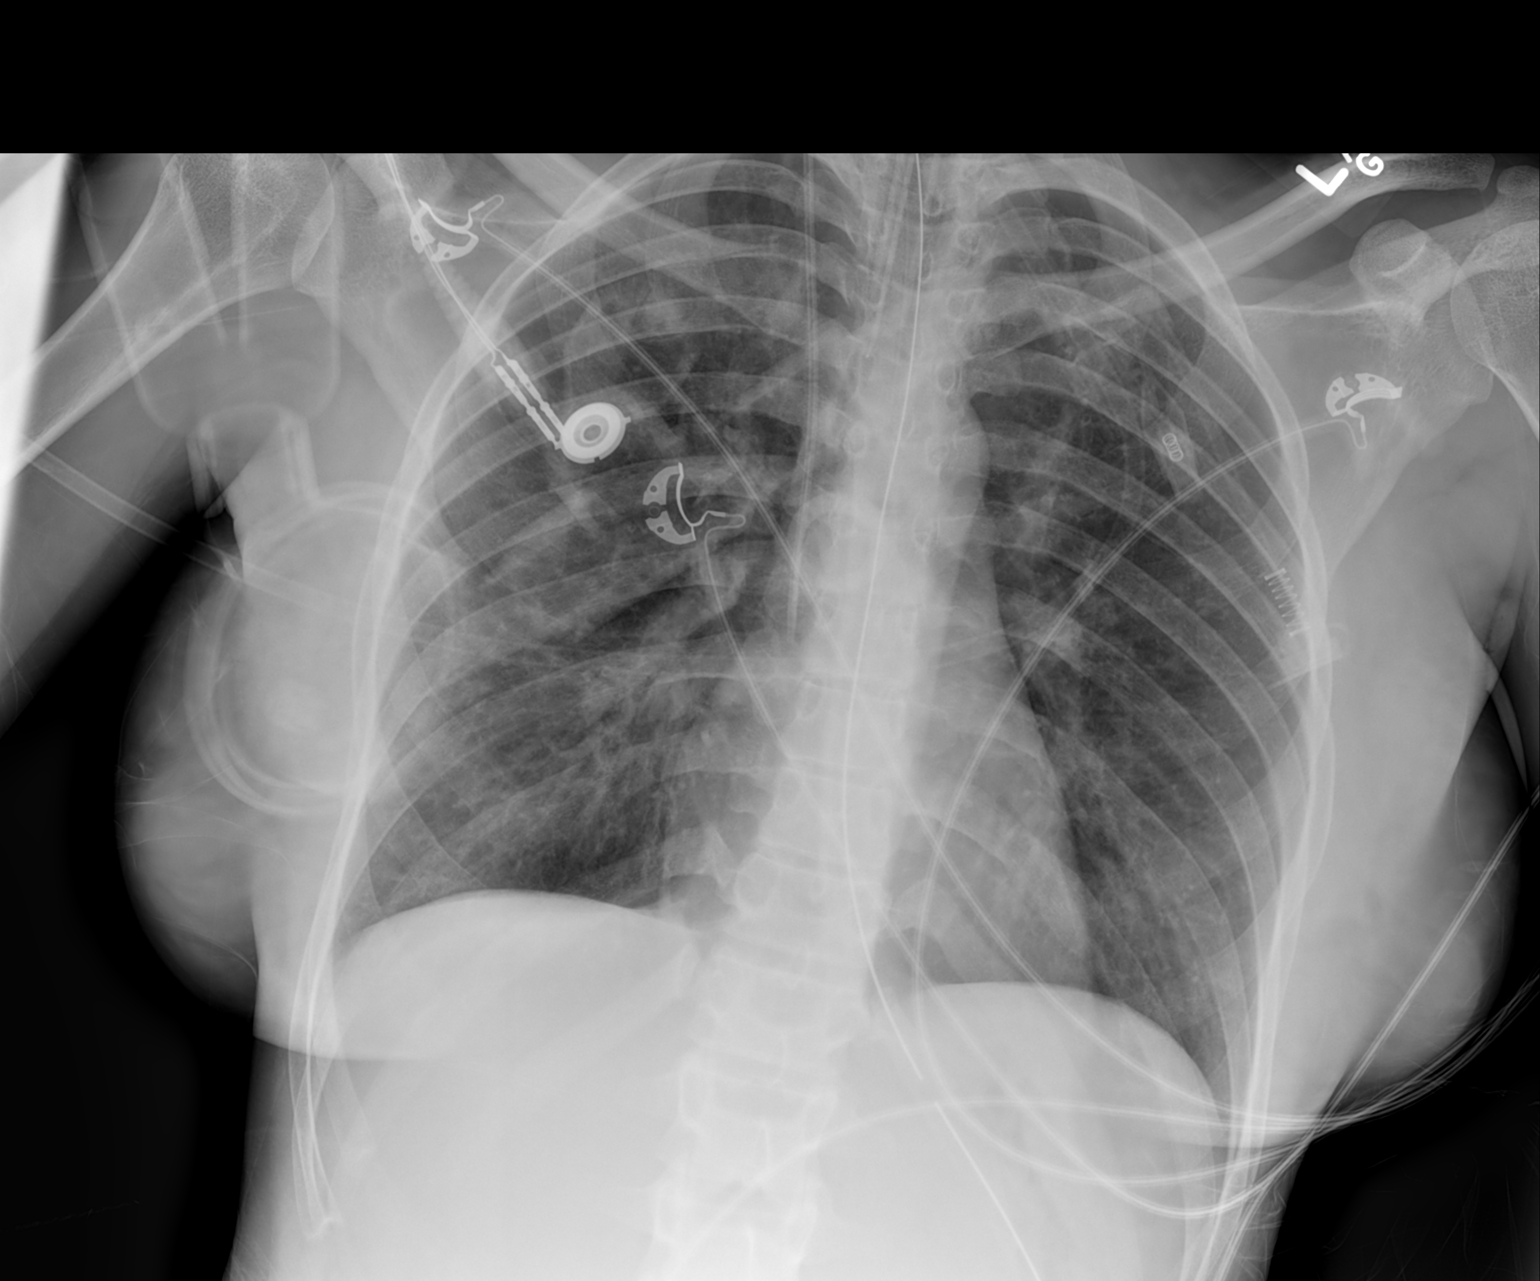

[1 of 1 positions shown; findings below may reference images not displayed]

FINDINGS: Endotracheal tube in the mid thoracic trachea. Enteric tube ends in
the stomach. Right IJ catheter ends at the superior cavoatrial
junction.

Lungs remain well aerated. No effusion or pneumothorax. Normal heart
size.
IMPRESSION: 1. Good positioning of tubes and lines.
2. Clear chest.

## 2014-09-05 IMAGING — CR DG CHEST 1V PORT
1 series · 1 of 1 positions shown · non-contrast
Comparison: Portable chest x-ray of 05/15/2013

CLINICAL DATA: Evaluate endotracheal tube position, airspace
disease

EXAM:
PORTABLE CHEST - 1 VIEW

[AP]
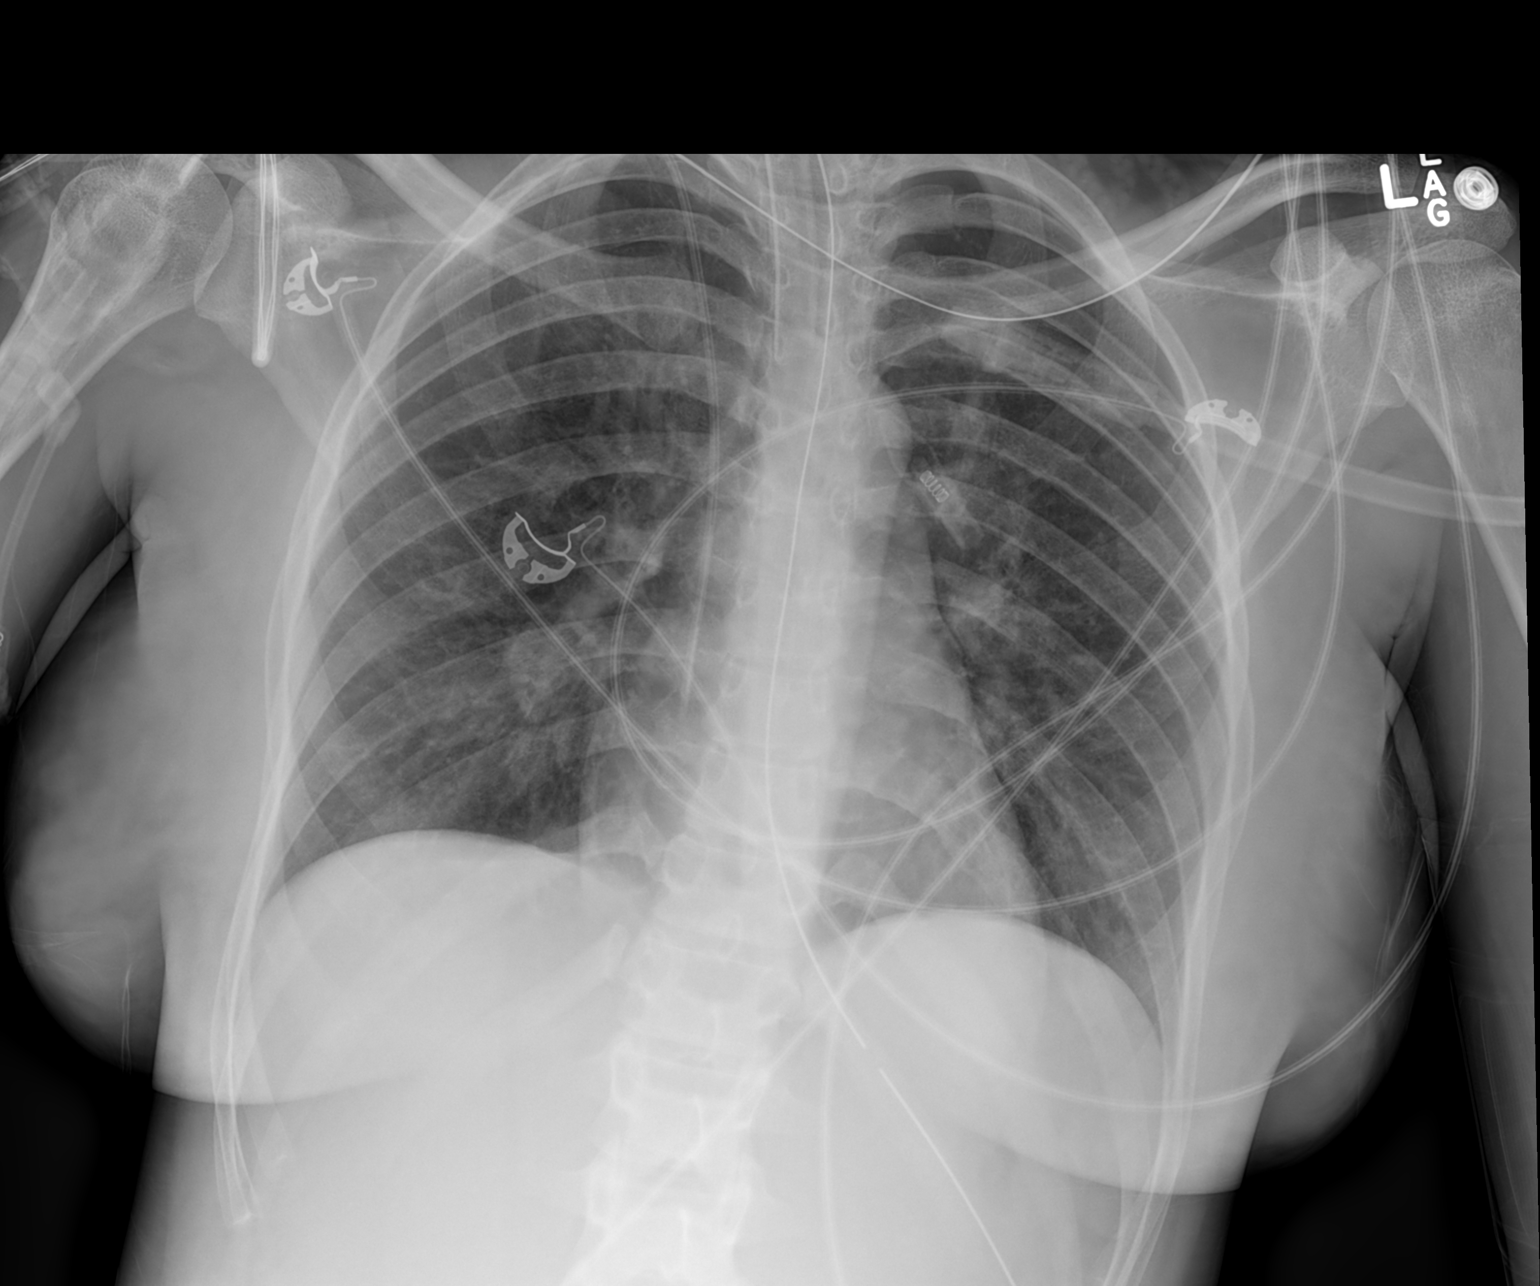

[1 of 1 positions shown; findings below may reference images not displayed]

FINDINGS: The tip of the endotracheal tube is approximately 2.9 cm above the
carina. There is no change in aeration. The right IJ central venous
line tip overlies the expected SVC- RA junction. Heart size is
stable.
IMPRESSION: Tip of endotracheal tube 2.9 cm above the carina. No change in
aeration.

## 2015-12-18 ENCOUNTER — Ambulatory Visit (INDEPENDENT_AMBULATORY_CARE_PROVIDER_SITE_OTHER): Payer: BLUE CROSS/BLUE SHIELD

## 2015-12-18 ENCOUNTER — Ambulatory Visit (HOSPITAL_COMMUNITY)
Admission: EM | Admit: 2015-12-18 | Discharge: 2015-12-18 | Disposition: A | Discharge status: Home / Self Care | Payer: BLUE CROSS/BLUE SHIELD | Attending: Emergency Medicine | Admitting: Emergency Medicine

## 2015-12-18 ENCOUNTER — Encounter (HOSPITAL_COMMUNITY): Payer: Self-pay | Admitting: Emergency Medicine

## 2015-12-18 DIAGNOSIS — S8012XA Contusion of left lower leg, initial encounter: Secondary | ICD-10-CM

## 2015-12-18 MED ORDER — IBUPROFEN 800 MG PO TABS
ORAL_TABLET | ORAL | Status: AC
  Filled 2015-12-18: qty 1

## 2015-12-18 MED ORDER — IBUPROFEN 800 MG PO TABS
800.0000 mg | ORAL_TABLET | Freq: Once | ORAL | Status: AC
  Administered 2015-12-18: 800 mg via ORAL
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
## 2015-12-18 NOTE — Discharge Instructions (Signed)
Contusion °A contusion is a deep bruise. Contusions happen when an injury causes bleeding under the skin. Symptoms of bruising include pain, swelling, and discolored skin. The skin may turn blue, purple, or yellow. °HOME CARE  °· Rest the injured area. °· If told, put ice on the injured area. °· Put ice in a plastic bag. °· Place a towel between your skin and the bag. °· Leave the ice on for 20 minutes, 2-3 times per day. °· If told, put light pressure (compression) on the injured area using an elastic bandage. Make sure the bandage is not too tight. Remove it and put it back on as told by your doctor. °· If possible, raise (elevate) the injured area above the level of your heart while you are sitting or lying down. °· Take over-the-counter and prescription medicines only as told by your doctor. °GET HELP IF: °· Your symptoms do not get better after several days of treatment. °· Your symptoms get worse. °· You have trouble moving the injured area. °GET HELP RIGHT AWAY IF:  °· You have very bad pain. °· You have a loss of feeling (numbness) in a hand or foot. °· Your hand or foot turns pale or cold. °  °This information is not intended to replace advice given to you by your health care provider. Make sure you discuss any questions you have with your health care provider. °  °Document Released: 02/01/2008 Document Revised: 05/06/2015 Document Reviewed: 12/31/2014 °Elsevier Interactive Patient Education ©2016 Elsevier Inc. ° °Cryotherapy °Cryotherapy is when you put ice on your injury. Ice helps lessen pain and puffiness (swelling) after an injury. Ice works the best when you start using it in the first 24 to 48 hours after an injury. °HOME CARE °· Put a dry or damp towel between the ice pack and your skin. °· You may press gently on the ice pack. °· Leave the ice on for no more than 10 to 20 minutes at a time. °· Check your skin after 5 minutes to make sure your skin is okay. °· Rest at least 20 minutes between ice  pack uses. °· Stop using ice when your skin loses feeling (numbness). °· Do not use ice on someone who cannot tell you when it hurts. This includes small children and people with memory problems (dementia). °GET HELP RIGHT AWAY IF: °· You have white spots on your skin. °· Your skin turns blue or pale. °· Your skin feels waxy or hard. °· Your puffiness gets worse. °MAKE SURE YOU:  °· Understand these instructions. °· Will watch your condition. °· Will get help right away if you are not doing well or get worse. °  °This information is not intended to replace advice given to you by your health care provider. Make sure you discuss any questions you have with your health care provider. °  °Document Released: 02/01/2008 Document Revised: 11/07/2011 Document Reviewed: 04/07/2011 °Elsevier Interactive Patient Education ©2016 Elsevier Inc. ° °
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
## 2015-12-22 ENCOUNTER — Emergency Department (HOSPITAL_BASED_OUTPATIENT_CLINIC_OR_DEPARTMENT_OTHER): Payer: BLUE CROSS/BLUE SHIELD

## 2015-12-22 ENCOUNTER — Encounter (HOSPITAL_BASED_OUTPATIENT_CLINIC_OR_DEPARTMENT_OTHER): Payer: Self-pay | Admitting: *Deleted

## 2015-12-22 ENCOUNTER — Emergency Department (HOSPITAL_BASED_OUTPATIENT_CLINIC_OR_DEPARTMENT_OTHER)
Admission: EM | Admit: 2015-12-22 | Discharge: 2015-12-22 | Disposition: A | Discharge status: Home / Self Care | Payer: BLUE CROSS/BLUE SHIELD | Attending: Emergency Medicine | Admitting: Emergency Medicine

## 2015-12-22 DIAGNOSIS — Z79899 Other long term (current) drug therapy: Secondary | ICD-10-CM | POA: Diagnosis not present

## 2015-12-22 DIAGNOSIS — Y9389 Activity, other specified: Secondary | ICD-10-CM | POA: Insufficient documentation

## 2015-12-22 DIAGNOSIS — J45909 Unspecified asthma, uncomplicated: Secondary | ICD-10-CM | POA: Insufficient documentation

## 2015-12-22 DIAGNOSIS — Y998 Other external cause status: Secondary | ICD-10-CM | POA: Diagnosis not present

## 2015-12-22 DIAGNOSIS — S8012XA Contusion of left lower leg, initial encounter: Secondary | ICD-10-CM | POA: Diagnosis not present

## 2015-12-22 DIAGNOSIS — Z87891 Personal history of nicotine dependence: Secondary | ICD-10-CM | POA: Diagnosis not present

## 2015-12-22 DIAGNOSIS — Y92410 Unspecified street and highway as the place of occurrence of the external cause: Secondary | ICD-10-CM | POA: Insufficient documentation

## 2015-12-22 DIAGNOSIS — I809 Phlebitis and thrombophlebitis of unspecified site: Secondary | ICD-10-CM | POA: Diagnosis not present

## 2015-12-22 DIAGNOSIS — S8992XA Unspecified injury of left lower leg, initial encounter: Secondary | ICD-10-CM | POA: Diagnosis present

## 2015-12-22 MED ORDER — IBUPROFEN 800 MG PO TABS: 800.0000 mg | ORAL_TABLET | Freq: Three times a day (TID) | ORAL | Status: AC

## 2015-12-22 MED ORDER — IBUPROFEN 800 MG PO TABS
800.0000 mg | ORAL_TABLET | Freq: Once | ORAL | Status: AC
  Administered 2015-12-22: 800 mg via ORAL
  Filled 2015-12-22: qty 1
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
## 2015-12-22 NOTE — Discharge Instructions (Signed)
Baby aspirin per day. Alternate heat and cold/ice. Activity as tolerated.  Contusion A contusion is a deep bruise. Contusions are the result of a blunt injury to tissues and muscle fibers under the skin. The injury causes bleeding under the skin. The skin overlying the contusion may turn blue, purple, or yellow. Minor injuries will give you a painless contusion, but more severe contusions may stay painful and swollen for a few weeks.  CAUSES  This condition is usually caused by a blow, trauma, or direct force to an area of the body. SYMPTOMS  Symptoms of this condition include:  Swelling of the injured area.  Pain and tenderness in the injured area.  Discoloration. The area may have redness and then turn blue, purple, or yellow. DIAGNOSIS  This condition is diagnosed based on a physical exam and medical history. An X-ray, CT scan, or MRI may be needed to determine if there are any associated injuries, such as broken bones (fractures). TREATMENT  Specific treatment for this condition depends on what area of the body was injured. In general, the best treatment for a contusion is resting, icing, applying pressure to (compression), and elevating the injured area. This is often called the RICE strategy. Over-the-counter anti-inflammatory medicines may also be recommended for pain control.  HOME CARE INSTRUCTIONS   Rest the injured area.  If directed, apply ice to the injured area:  Put ice in a plastic bag.  Place a towel between your skin and the bag.  Leave the ice on for 20 minutes, 2-3 times per day.  If directed, apply light compression to the injured area using an elastic bandage. Make sure the bandage is not wrapped too tightly. Remove and reapply the bandage as directed by your health care provider.  If possible, raise (elevate) the injured area above the level of your heart while you are sitting or lying down.  Take over-the-counter and prescription medicines only as told by  your health care provider. SEEK MEDICAL CARE IF:  Your symptoms do not improve after several days of treatment.  Your symptoms get worse.  You have difficulty moving the injured area. SEEK IMMEDIATE MEDICAL CARE IF:   You have severe pain.  You have numbness in a hand or foot.  Your hand or foot turns pale or cold.   This information is not intended to replace advice given to you by your health care provider. Make sure you discuss any questions you have with your health care provider.   Document Released: 05/25/2005 Document Revised: 05/06/2015 Document Reviewed: 12/31/2014 Elsevier Interactive Patient Education [employer]2016 Elsevier Inc.

## 2024-08-16 ENCOUNTER — Other Ambulatory Visit: Payer: Self-pay

## 2024-08-16 DIAGNOSIS — Y92410 Unspecified street and highway as the place of occurrence of the external cause: Secondary | ICD-10-CM | POA: Diagnosis not present

## 2024-08-16 DIAGNOSIS — S20212A Contusion of left front wall of thorax, initial encounter: Secondary | ICD-10-CM | POA: Insufficient documentation

## 2024-08-16 DIAGNOSIS — S29001A Unspecified injury of muscle and tendon of front wall of thorax, initial encounter: Secondary | ICD-10-CM | POA: Diagnosis present

## 2024-08-17 ENCOUNTER — Emergency Department (HOSPITAL_BASED_OUTPATIENT_CLINIC_OR_DEPARTMENT_OTHER)

## 2024-08-17 ENCOUNTER — Encounter (HOSPITAL_BASED_OUTPATIENT_CLINIC_OR_DEPARTMENT_OTHER): Payer: Self-pay

## 2024-08-17 ENCOUNTER — Emergency Department (HOSPITAL_BASED_OUTPATIENT_CLINIC_OR_DEPARTMENT_OTHER)
Admission: EM | Admit: 2024-08-17 | Discharge: 2024-08-17 | Disposition: A | Attending: Emergency Medicine | Admitting: Emergency Medicine

## 2024-08-17 ENCOUNTER — Other Ambulatory Visit: Payer: Self-pay

## 2024-08-17 DIAGNOSIS — S20219A Contusion of unspecified front wall of thorax, initial encounter: Secondary | ICD-10-CM

## 2024-08-17 NOTE — Discharge Instructions (Signed)
 Rest.  Take ibuprofen  600 mg every 6 hours as needed for pain.  Follow-up with primary doctor if symptoms persist, and return to the ER if symptoms significantly worsen or change.
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
